# Patient Record
Sex: Female | Born: 1935
Health system: Southern US, Community
[De-identification: ages and names within clinical notes are randomized; demographics above are authoritative.]

## PROBLEM LIST (undated history)

## (undated) DIAGNOSIS — G4733 Obstructive sleep apnea (adult) (pediatric): Secondary | ICD-10-CM

## (undated) DIAGNOSIS — K219 Gastro-esophageal reflux disease without esophagitis: Secondary | ICD-10-CM

## (undated) DIAGNOSIS — Z9989 Dependence on other enabling machines and devices: Secondary | ICD-10-CM

## (undated) DIAGNOSIS — E559 Vitamin D deficiency, unspecified: Secondary | ICD-10-CM

## (undated) DIAGNOSIS — I10 Essential (primary) hypertension: Secondary | ICD-10-CM

## (undated) DIAGNOSIS — I4892 Unspecified atrial flutter: Secondary | ICD-10-CM

## (undated) DIAGNOSIS — E785 Hyperlipidemia, unspecified: Secondary | ICD-10-CM

## (undated) DIAGNOSIS — I4891 Unspecified atrial fibrillation: Secondary | ICD-10-CM

## (undated) DIAGNOSIS — E119 Type 2 diabetes mellitus without complications: Secondary | ICD-10-CM

## (undated) HISTORY — DX: Hyperlipidemia, unspecified: E78.5

## (undated) HISTORY — DX: Essential (primary) hypertension: I10

## (undated) HISTORY — PX: CHOLECYSTECTOMY: SHX55

## (undated) HISTORY — DX: Dependence on other enabling machines and devices: Z99.89

## (undated) HISTORY — DX: Unspecified atrial flutter: I48.92

## (undated) HISTORY — DX: Obstructive sleep apnea (adult) (pediatric): G47.33

## (undated) HISTORY — PX: ABDOMINAL HYSTERECTOMY: SHX81

## (undated) HISTORY — DX: Unspecified atrial fibrillation: I48.91

## (undated) HISTORY — DX: Type 2 diabetes mellitus without complications: E11.9

## (undated) HISTORY — DX: Gastro-esophageal reflux disease without esophagitis: K21.9

## (undated) HISTORY — DX: Vitamin D deficiency, unspecified: E55.9

---

## 2002-08-30 ENCOUNTER — Ambulatory Visit (HOSPITAL_COMMUNITY): Admission: RE | Admit: 2002-08-30 | Discharge: 2002-08-31 | Payer: Self-pay | Admitting: Internal Medicine

## 2004-07-31 ENCOUNTER — Ambulatory Visit: Payer: Self-pay | Admitting: Cardiology

## 2004-08-07 ENCOUNTER — Ambulatory Visit: Payer: Self-pay | Admitting: Cardiology

## 2004-08-24 ENCOUNTER — Ambulatory Visit: Payer: Self-pay | Admitting: Cardiology

## 2004-09-07 ENCOUNTER — Ambulatory Visit: Payer: Self-pay | Admitting: Cardiology

## 2004-09-21 ENCOUNTER — Ambulatory Visit: Payer: Self-pay | Admitting: Cardiology

## 2004-09-27 ENCOUNTER — Ambulatory Visit: Payer: Self-pay | Admitting: Internal Medicine

## 2004-10-04 ENCOUNTER — Ambulatory Visit: Payer: Self-pay | Admitting: Cardiology

## 2004-10-12 ENCOUNTER — Ambulatory Visit: Payer: Self-pay | Admitting: Cardiology

## 2004-10-19 ENCOUNTER — Ambulatory Visit: Payer: Self-pay | Admitting: Cardiology

## 2004-11-15 ENCOUNTER — Ambulatory Visit: Payer: Self-pay | Admitting: Cardiology

## 2004-11-22 ENCOUNTER — Ambulatory Visit: Payer: Self-pay | Admitting: Cardiology

## 2004-11-30 ENCOUNTER — Ambulatory Visit: Payer: Self-pay | Admitting: Internal Medicine

## 2004-12-20 ENCOUNTER — Ambulatory Visit: Payer: Self-pay | Admitting: Cardiology

## 2005-01-17 ENCOUNTER — Ambulatory Visit: Payer: Self-pay | Admitting: Cardiology

## 2005-01-24 ENCOUNTER — Ambulatory Visit: Payer: Self-pay | Admitting: Cardiology

## 2005-02-25 ENCOUNTER — Ambulatory Visit: Payer: Self-pay | Admitting: Cardiology

## 2005-03-05 ENCOUNTER — Ambulatory Visit: Payer: Self-pay | Admitting: Cardiology

## 2005-03-15 ENCOUNTER — Ambulatory Visit: Payer: Self-pay | Admitting: Internal Medicine

## 2005-04-11 ENCOUNTER — Ambulatory Visit: Payer: Self-pay | Admitting: Cardiology

## 2005-04-18 ENCOUNTER — Ambulatory Visit: Payer: Self-pay | Admitting: Cardiology

## 2005-05-20 ENCOUNTER — Ambulatory Visit: Payer: Self-pay | Admitting: Cardiology

## 2005-06-10 ENCOUNTER — Ambulatory Visit: Payer: Self-pay | Admitting: Cardiology

## 2005-06-14 ENCOUNTER — Ambulatory Visit: Payer: Self-pay | Admitting: Cardiology

## 2005-06-21 ENCOUNTER — Ambulatory Visit: Payer: Self-pay | Admitting: Cardiology

## 2005-08-13 ENCOUNTER — Ambulatory Visit: Payer: Self-pay | Admitting: Cardiology

## 2005-09-10 ENCOUNTER — Ambulatory Visit: Payer: Self-pay | Admitting: Cardiology

## 2005-09-17 ENCOUNTER — Ambulatory Visit: Payer: Self-pay | Admitting: Cardiology

## 2005-09-26 ENCOUNTER — Ambulatory Visit: Payer: Self-pay | Admitting: Cardiology

## 2005-10-03 ENCOUNTER — Ambulatory Visit: Payer: Self-pay | Admitting: Cardiology

## 2005-10-11 ENCOUNTER — Ambulatory Visit: Payer: Self-pay | Admitting: Internal Medicine

## 2005-10-18 ENCOUNTER — Ambulatory Visit: Payer: Self-pay | Admitting: Cardiology

## 2005-10-24 ENCOUNTER — Ambulatory Visit: Payer: Self-pay | Admitting: Cardiology

## 2005-11-07 ENCOUNTER — Ambulatory Visit: Payer: Self-pay | Admitting: Cardiology

## 2005-11-11 ENCOUNTER — Ambulatory Visit: Payer: Self-pay | Admitting: Cardiology

## 2005-11-13 ENCOUNTER — Ambulatory Visit: Payer: Self-pay | Admitting: Cardiology

## 2005-11-18 ENCOUNTER — Ambulatory Visit: Payer: Self-pay | Admitting: Cardiology

## 2005-12-16 ENCOUNTER — Ambulatory Visit: Payer: Self-pay | Admitting: Cardiology

## 2005-12-25 ENCOUNTER — Ambulatory Visit: Payer: Self-pay | Admitting: Cardiology

## 2006-01-08 ENCOUNTER — Ambulatory Visit: Payer: Self-pay | Admitting: Cardiology

## 2006-01-10 ENCOUNTER — Ambulatory Visit: Payer: Self-pay | Admitting: Cardiology

## 2006-01-24 ENCOUNTER — Ambulatory Visit: Payer: Self-pay | Admitting: Cardiology

## 2006-01-27 ENCOUNTER — Ambulatory Visit: Payer: Self-pay | Admitting: Cardiology

## 2006-01-30 ENCOUNTER — Ambulatory Visit: Payer: Self-pay | Admitting: Cardiology

## 2006-02-03 ENCOUNTER — Ambulatory Visit: Payer: Self-pay | Admitting: Cardiology

## 2006-02-11 ENCOUNTER — Ambulatory Visit: Payer: Self-pay | Admitting: Cardiology

## 2006-02-19 ENCOUNTER — Ambulatory Visit: Payer: Self-pay | Admitting: Cardiology

## 2006-02-27 ENCOUNTER — Ambulatory Visit: Payer: Self-pay | Admitting: Cardiology

## 2006-03-03 ENCOUNTER — Ambulatory Visit: Payer: Self-pay | Admitting: Cardiology

## 2006-03-06 ENCOUNTER — Ambulatory Visit: Payer: Self-pay | Admitting: Cardiology

## 2006-03-25 ENCOUNTER — Ambulatory Visit: Payer: Self-pay | Admitting: Cardiology

## 2006-03-27 ENCOUNTER — Ambulatory Visit: Payer: Self-pay | Admitting: Cardiology

## 2006-03-31 ENCOUNTER — Ambulatory Visit: Payer: Self-pay | Admitting: Cardiology

## 2006-04-04 ENCOUNTER — Ambulatory Visit: Payer: Self-pay | Admitting: Cardiology

## 2006-04-07 ENCOUNTER — Ambulatory Visit: Payer: Self-pay | Admitting: Cardiology

## 2006-04-11 ENCOUNTER — Ambulatory Visit: Payer: Self-pay | Admitting: Cardiology

## 2006-04-18 ENCOUNTER — Ambulatory Visit: Payer: Self-pay | Admitting: Cardiology

## 2006-05-02 ENCOUNTER — Ambulatory Visit: Payer: Self-pay | Admitting: Cardiology

## 2006-05-29 ENCOUNTER — Ambulatory Visit: Payer: Self-pay | Admitting: Cardiology

## 2006-07-24 ENCOUNTER — Ambulatory Visit: Payer: Self-pay | Admitting: Cardiology

## 2006-07-31 ENCOUNTER — Ambulatory Visit: Payer: Self-pay | Admitting: Cardiology

## 2006-08-15 ENCOUNTER — Ambulatory Visit: Payer: Self-pay | Admitting: Cardiology

## 2006-08-26 ENCOUNTER — Ambulatory Visit: Payer: Self-pay | Admitting: Cardiology

## 2006-09-04 ENCOUNTER — Ambulatory Visit: Payer: Self-pay | Admitting: Cardiology

## 2006-09-17 ENCOUNTER — Ambulatory Visit: Payer: Self-pay | Admitting: Cardiology

## 2006-09-26 ENCOUNTER — Ambulatory Visit: Payer: Self-pay | Admitting: Cardiology

## 2006-12-29 ENCOUNTER — Encounter: Payer: Self-pay | Admitting: Cardiology

## 2007-05-18 ENCOUNTER — Ambulatory Visit: Payer: Self-pay | Admitting: Cardiology

## 2007-06-16 ENCOUNTER — Ambulatory Visit: Payer: Self-pay | Admitting: Cardiology

## 2007-07-14 ENCOUNTER — Ambulatory Visit: Payer: Self-pay | Admitting: Cardiology

## 2007-07-24 ENCOUNTER — Ambulatory Visit: Payer: Self-pay | Admitting: Cardiology

## 2007-08-07 ENCOUNTER — Ambulatory Visit: Payer: Self-pay | Admitting: Cardiology

## 2007-08-27 ENCOUNTER — Ambulatory Visit: Payer: Self-pay | Admitting: Cardiology

## 2007-09-04 ENCOUNTER — Ambulatory Visit: Payer: Self-pay | Admitting: Cardiology

## 2007-10-02 ENCOUNTER — Ambulatory Visit: Payer: Self-pay | Admitting: Cardiology

## 2007-10-30 ENCOUNTER — Ambulatory Visit: Payer: Self-pay | Admitting: Cardiology

## 2007-12-03 ENCOUNTER — Ambulatory Visit: Payer: Self-pay | Admitting: Cardiology

## 2007-12-24 ENCOUNTER — Ambulatory Visit: Payer: Self-pay | Admitting: Cardiology

## 2008-01-26 ENCOUNTER — Ambulatory Visit: Payer: Self-pay | Admitting: Cardiology

## 2008-02-11 ENCOUNTER — Ambulatory Visit: Payer: Self-pay | Admitting: Cardiology

## 2008-03-11 ENCOUNTER — Ambulatory Visit: Payer: Self-pay | Admitting: Cardiology

## 2008-04-11 ENCOUNTER — Ambulatory Visit: Payer: Self-pay | Admitting: Cardiology

## 2008-05-16 ENCOUNTER — Ambulatory Visit: Payer: Self-pay | Admitting: Cardiology

## 2008-05-31 ENCOUNTER — Ambulatory Visit: Payer: Self-pay | Admitting: Cardiology

## 2008-07-01 ENCOUNTER — Ambulatory Visit: Payer: Self-pay | Admitting: Cardiology

## 2008-07-15 ENCOUNTER — Ambulatory Visit: Payer: Self-pay | Admitting: Cardiology

## 2008-07-26 ENCOUNTER — Ambulatory Visit: Payer: Self-pay | Admitting: Cardiology

## 2008-08-17 ENCOUNTER — Ambulatory Visit: Payer: Self-pay | Admitting: Cardiology

## 2008-09-02 ENCOUNTER — Ambulatory Visit: Payer: Self-pay | Admitting: Cardiology

## 2008-09-27 ENCOUNTER — Ambulatory Visit: Payer: Self-pay | Admitting: Cardiology

## 2008-10-17 ENCOUNTER — Encounter: Payer: Self-pay | Admitting: Cardiology

## 2008-11-04 ENCOUNTER — Ambulatory Visit: Payer: Self-pay | Admitting: Cardiology

## 2008-11-29 ENCOUNTER — Ambulatory Visit: Payer: Self-pay | Admitting: Cardiology

## 2008-12-08 ENCOUNTER — Encounter: Payer: Self-pay | Admitting: Cardiology

## 2008-12-08 ENCOUNTER — Ambulatory Visit: Payer: Self-pay | Admitting: Cardiology

## 2008-12-14 ENCOUNTER — Ambulatory Visit: Payer: Self-pay | Admitting: Cardiology

## 2009-03-23 ENCOUNTER — Encounter: Payer: Self-pay | Admitting: Cardiology

## 2009-04-28 ENCOUNTER — Ambulatory Visit: Payer: Self-pay | Admitting: Cardiology

## 2009-04-28 ENCOUNTER — Encounter: Payer: Self-pay | Admitting: Cardiology

## 2009-05-05 ENCOUNTER — Ambulatory Visit: Payer: Self-pay | Admitting: Cardiology

## 2009-05-08 ENCOUNTER — Encounter: Payer: Self-pay | Admitting: *Deleted

## 2009-05-18 ENCOUNTER — Encounter (INDEPENDENT_AMBULATORY_CARE_PROVIDER_SITE_OTHER): Payer: Self-pay | Admitting: *Deleted

## 2009-05-18 DIAGNOSIS — I4891 Unspecified atrial fibrillation: Secondary | ICD-10-CM | POA: Insufficient documentation

## 2009-05-18 DIAGNOSIS — I4892 Unspecified atrial flutter: Secondary | ICD-10-CM

## 2009-05-18 DIAGNOSIS — I10 Essential (primary) hypertension: Secondary | ICD-10-CM | POA: Insufficient documentation

## 2009-08-14 ENCOUNTER — Encounter: Payer: Self-pay | Admitting: Cardiology

## 2009-08-23 ENCOUNTER — Encounter: Payer: Self-pay | Admitting: Cardiology

## 2009-11-27 ENCOUNTER — Telehealth (INDEPENDENT_AMBULATORY_CARE_PROVIDER_SITE_OTHER): Payer: Self-pay | Admitting: *Deleted

## 2010-01-17 ENCOUNTER — Ambulatory Visit: Payer: Self-pay | Admitting: Cardiology

## 2010-01-17 DIAGNOSIS — F411 Generalized anxiety disorder: Secondary | ICD-10-CM | POA: Insufficient documentation

## 2010-10-23 NOTE — Progress Notes (Signed)
Summary: NEED OV  Phone Note Outgoing Call Call back at [facility] Phone [phone]   Call placed by: Georgina Peer,  November 27, 2009 4:44 PM Call placed to: Patient Summary of Call: called and left message on voicemail that she was overdue for f/u and to please call office to schedule ov.  Initial call taken by: Georgina Peer,  November 27, 2009 4:45 PM

## 2010-10-23 NOTE — Assessment & Plan Note (Signed)
Summary: 6 MONTH FU -RECV LETTER VS   Visit Type:  Follow-up Primary Provider:  Woody Seller  CC:  follow-up visit.  History of Present Illness: the patient is a 75 year old female with history of atrial flutter/atrial fibrillation. She is maintaining normal sinus rhythm on Flecainide. She is on Coumadin. The patient also significant anxiety.she has a history of hypertension which is well-controlled.  The patient still reports some anxiety with episodes of what she calls meltdowns. She denies any chest pain. She denies any orthopnea PND palpitations or syncope.from a cardiovascular standpoint is otherwise stable  Preventive Screening-Counseling & Management  Alcohol-Tobacco     Smoking Status: never     Year Quit: 1985  Current Medications (verified): 1)  Cardura 4 Mg Tabs (Doxazosin Mesylate) .... Take 1 Tablet By Mouth Once A Day 2)  Flecainide Acetate 100 Mg Tabs (Flecainide Acetate) .... Take 1 Tablet By Mouth Twice A Day 3)  Amlodipine Besylate 5 Mg Tabs (Amlodipine Besylate) .... Take One Tablet By Mouth Daily 4)  Citalopram Hydrobromide 20 Mg Tabs (Citalopram Hydrobromide) .... Take 1 Tablet By Mouth Once A Day 5)  Aspirin 81 Mg Tbec (Aspirin) .... Take One Tablet By Mouth Daily 6)  Prilosec 20 Mg Cpdr (Omeprazole) .... Take 1 Tablet By Mouth Once A Day 7)  Hydrochlorothiazide 25 Mg Tabs (Hydrochlorothiazide) .... Take One Tablet By Mouth Daily. 8)  Gemfibrozil 600 Mg Tabs (Gemfibrozil) .... Take 1 Tablet By Mouth Two Times A Day 9)  Lopressor 50 Mg Tabs (Metoprolol Tartrate) .... Take 1 Tablet By Mouth Two Times A Day 10)  Tramadol Hcl 50 Mg Tabs (Tramadol Hcl) .... Every 6 Hours As Needed 11)  Crestor 10 Mg Tabs (Rosuvastatin Calcium) .... Take 1/2 Tablet By Mouth Once A Day 12)  Glimepiride 2 Mg Tabs (Glimepiride) .... Take 1 Tablet By Mouth Once A Day 13)  Metformin Hcl 500 Mg Tabs (Metformin Hcl) .... Take 1/2 Tablet By Mouth Once A Day 14)  Clonazepam 0.25 Mg Tbdp (Clonazepam)  .... May Use Two Times A Day As Needed  Allergies (verified): 1)  Amoxicillin (Amoxicillin) 2)  Codeine Phosphate (Codeine Phosphate)  Comments:  Nurse/Medical Assistant: The patient's medications and allergies were reviewed with the patient and were updated in the Medication and Allergy Lists. List reviewed.  Past History:  Past Medical History: Last updated: 05/18/2009 anxiety HYPERTENSION, UNSPECIFIED (ICD-401.9) ATRIAL FLUTTER (ICD-427.32) ATRIAL FIBRILLATION (ICD-427.31)    Family History: Negative FH of Diabetes, Hypertension, or Coronary Artery Disease  Social History: Retired  Tobacco Use - No.  Smoking Status:  never  Review of Systems       The patient complains of anxiety.  The patient denies fatigue, malaise, fever, weight gain/loss, vision loss, decreased hearing, hoarseness, chest pain, palpitations, shortness of breath, prolonged cough, wheezing, sleep apnea, coughing up blood, abdominal pain, blood in stool, nausea, vomiting, diarrhea, heartburn, incontinence, blood in urine, muscle weakness, joint pain, leg swelling, rash, skin lesions, headache, fainting, dizziness, depression, enlarged lymph nodes, easy bruising or bleeding, and environmental allergies.    Vital Signs:  Patient profile:   75 year old female Height:      66 inches Weight:      245 pounds BMI:     39.69 Pulse rate:   59 / minute BP sitting:   122 / 76  (left arm) Cuff size:   large  Vitals Entered By: Georgina Peer (January 17, 2010 11:10 AM)  Nutrition Counseling: Patient's BMI is greater than 25 and therefore counseled  on weight management options. CC: follow-up visit   Physical Exam  Additional Exam:  General: Well-developed, well-nourished in no distress head: Normocephalic and atraumatic eyes PERRLA/EOMI intact, conjunctiva and lids normal nose: No deformity or lesions mouth normal dentition, normal posterior pharynx neck: Supple, no JVD.  No masses, thyromegaly or abnormal  cervical nodes lungs: Normal breath sounds bilaterally without wheezing.  Normal percussion heart: regular rate and rhythm with normal S1 and S2, no S3 or S4.  PMI is normal.  No pathological murmurs abdomen: Normal bowel sounds, abdomen is soft and nontender without masses, organomegaly or hernias noted.  No hepatosplenomegaly musculoskeletal: Back normal, normal gait muscle strength and tone normal pulsus: Pulse is normal in all 4 extremities Extremities: No peripheral pitting edema neurologic: Alert and oriented x 3 skin: Intact without lesions or rashes cervical nodes: No significant adenopathy psychologic: Normal affect    EKG  Procedure date:  01/17/2010  Findings:      SB, NSTT  Impression & Recommendations:  Problem # 1:  ATRIAL FLUTTER (ICD-427.32) patient is maintaining normal sinus rhythm. We will continue flecainide. Her updated medication list for this problem includes:    Flecainide Acetate 100 Mg Tabs (Flecainide acetate) .Marland Kitchen... Take 1 tablet by mouth twice a day    Aspirin 81 Mg Tbec (Aspirin) .Marland Kitchen... Take one tablet by mouth daily    Lopressor 50 Mg Tabs (Metoprolol tartrate) .Marland Kitchen... Take 1 tablet by mouth two times a day  Problem # 2:  HYPERTENSION, UNSPECIFIED (ICD-401.9) well-controlled. No further adjustments in medications The following medications were removed from the medication list:    Diovan 320 Mg Tabs (Valsartan) .Marland Kitchen... Take 1 tablet by mouth once a day Her updated medication list for this problem includes:    Cardura 4 Mg Tabs (Doxazosin mesylate) .Marland Kitchen... Take 1 tablet by mouth once a day    Amlodipine Besylate 5 Mg Tabs (Amlodipine besylate) .Marland Kitchen... Take one tablet by mouth daily    Aspirin 81 Mg Tbec (Aspirin) .Marland Kitchen... Take one tablet by mouth daily    Hydrochlorothiazide 25 Mg Tabs (Hydrochlorothiazide) .Marland Kitchen... Take one tablet by mouth daily.    Lopressor 50 Mg Tabs (Metoprolol tartrate) .Marland Kitchen... Take 1 tablet by mouth two times a day  Problem # 3:  ANXIETY  (ICD-300.00) Assessment: New Increased citalopram to 20mg  by mouth qdaily. I also prescribed a short- term script of clonazepam.   Other Orders: EKG w/ Interpretation (93000)  Patient Instructions: 1)  Compression stockings 2)  Increase Citalopram 20mg  daily 3)  Clonazepam 0.25mg  two times a day as needed  4)  Follow up in  6 months  Prescriptions: CLONAZEPAM 0.25 MG TBDP (CLONAZEPAM) may use two times a day as needed  #30 x 0   Entered by:   Lovina Reach, LPN   Authorized by:   Terald Sleeper, MD, [facility]   Signed by:   Lovina Reach, LPN on D34-534   Method used:   Print then Give to Patient   [id number]   Handout requested. CITALOPRAM HYDROBROMIDE 20 MG TABS (CITALOPRAM HYDROBROMIDE) Take 1 tablet by mouth once a day  #30 x 2   Entered by:   Lovina Reach, LPN   Authorized by:   Terald Sleeper, MD, [facility]   Signed by:   Lovina Reach, LPN on D34-534   Method used:   Electronically to        [facility] (retail)       [address]  [place], [place]  [id number]       Ph: [phone]       Fax: [phone]   RxID:   [id number]

## 2010-11-23 ENCOUNTER — Ambulatory Visit: Payer: Self-pay | Admitting: Cardiology

## 2010-12-10 ENCOUNTER — Telehealth: Payer: Self-pay | Admitting: *Deleted

## 2010-12-14 ENCOUNTER — Telehealth: Payer: Self-pay | Admitting: *Deleted

## 2010-12-14 NOTE — Telephone Encounter (Signed)
Pt left message on voicemail stating that she needs refill for amlodipine for bp. She thought Dr. Woody Seller prescribed this but Dr. Lutricia Feil prescribed it. She states when saw Dr. Woody Seller 1 wk ago he told her to take 1 pill in am and pm. She states she now needs a new prescription called in for this increased number.  Contacted pt and notified her that Dr. Woody Seller should handle this dosage change/refill since he made the change to the prescription. Pt states she told his office this but was told to contact our office. She will call them back and ask for refill w/increased number.

## 2010-12-20 NOTE — Progress Notes (Signed)
Summary: PHONE: REFILLS  Phone Note Call from Patient Call back at Home Phone [phone]   Caller: Patient Reason for Call: Refill Medication Summary of Call: States that [facility] still does not have her updated prescriptions. Initial call taken by: Delfino Lovett,  December 10, 2010 9:54 AM  Follow-up for Phone Call        Patient informed of the above via machine.  Follow-up by: Georgina Peer,  December 10, 2010 10:06 AM    Prescriptions: CITALOPRAM HYDROBROMIDE 20 MG TABS (CITALOPRAM HYDROBROMIDE) Take 1 tablet by mouth once a day  #30 x 0   Entered by:   Georgina Peer   Authorized by:   Terald Sleeper, MD, [facility]   Signed by:   Georgina Peer on 12/10/2010   Method used:   Electronically to        [id number] * (retail)       [place]  [address]       Ph: [phone]       Fax: [phone]   RxID:   [id number] AMLODIPINE BESYLATE 5 MG TABS (AMLODIPINE BESYLATE) Take one tablet by mouth daily  #30 x 0   Entered by:   Georgina Peer   Authorized by:   Terald Sleeper, MD, [facility]   Signed by:   Georgina Peer on 12/10/2010   Method used:   Electronically to        [facility] [id number] * (retail)       [place]  [address]       Ph: [phone]       Fax: [phone]   [id number] FLECAINIDE ACETATE 100 MG TABS (FLECAINIDE ACETATE) Take 1 tablet by mouth twice a day  #60 x 0   Entered by:   Georgina Peer   Authorized by:   Terald Sleeper, MD, [facility]   Signed by:   Georgina Peer on 12/10/2010   Method used:   Electronically to        [id number] * (retail)       [place]  [address]       Ph: [phone]       Fax: [phone]   [id number] CARDURA 4 MG TABS (DOXAZOSIN MESYLATE) Take 1/2 tablet by mouth once a day  #15 x 0   Entered by:   Georgina Peer   Authorized by:   Terald Sleeper, MD, [facility]   Signed by:   Georgina Peer on 12/10/2010   Method  used:   Electronically to        [facility] [id number] * (retail)       [address] [place]  [address]       Ph: [phone]       Fax: [phone]   [id number]

## 2011-01-04 ENCOUNTER — Encounter: Payer: Self-pay | Admitting: Cardiology

## 2011-01-04 ENCOUNTER — Ambulatory Visit (INDEPENDENT_AMBULATORY_CARE_PROVIDER_SITE_OTHER): Payer: Medicare Other | Admitting: Cardiology

## 2011-01-04 VITALS — BP 113/59 | HR 56 | Ht 66.0 in | Wt 253.0 lb

## 2011-01-04 DIAGNOSIS — I4891 Unspecified atrial fibrillation: Secondary | ICD-10-CM

## 2011-01-04 DIAGNOSIS — Z7901 Long term (current) use of anticoagulants: Secondary | ICD-10-CM | POA: Insufficient documentation

## 2011-01-04 DIAGNOSIS — F411 Generalized anxiety disorder: Secondary | ICD-10-CM

## 2011-01-04 DIAGNOSIS — I10 Essential (primary) hypertension: Secondary | ICD-10-CM

## 2011-01-04 NOTE — Progress Notes (Signed)
HPI The patient is a 75 year old female with a history of atrial flutter/atrial fibrillation. Previously she was maintaining normal sinus rhythm and is on flecainide. She is also on Coumadin. She has a history of significant anxiety. She also has hypertension which is previously well controlled. Blood work done in September of 2011 showed a normal hemoglobin and normal renal function. She did have a low vitamin D level. 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 Fortunately she has not had to use any. I reviewed her electrocardiogram on the current dose of citalopram and the QTC is 467 ms. This is in conjunction with flecainide.  Allergies  Allergen Reactions  . Amoxicillin     REACTION: rash  . Codeine Phosphate     REACTION: vomiting    Current Outpatient Prescriptions on File Prior to Visit  Medication Sig Dispense Refill  . amLODipine (NORVASC) 5 MG tablet Take 5 mg by mouth 2 (two) times daily.       Marland Kitchen aspirin 81 MG tablet Take 81 mg by mouth daily.        . citalopram (CELEXA) 20 MG tablet Take 20 mg by mouth daily.        . clonazePAM (KLONOPIN) 0.25 MG disintegrating tablet Take 0.25 mg by mouth 2 (two) times daily as needed.        . doxazosin (CARDURA) 4 MG tablet Take 4 mg by mouth at bedtime.       . flecainide (TAMBOCOR) 100 MG tablet Take 100 mg by mouth 2 (two) times daily.        Marland Kitchen gemfibrozil (LOPID) 600 MG tablet Take 600 mg by mouth 2 (two) times daily before a meal.        . glimepiride (AMARYL) 2 MG tablet Take 2 mg by mouth daily before breakfast.        . hydrochlorothiazide 25 MG tablet Take 25 mg by mouth daily.        . metformin (FORTAMET) 500 MG (OSM) 24 hr tablet Take 250 mg by mouth daily with breakfast.       . metoprolol (LOPRESSOR) 50 MG tablet Take 50 mg by mouth 2 (two) times daily.        Marland Kitchen omeprazole (PRILOSEC) 20 MG capsule Take 20 mg by mouth daily.        . rosuvastatin (CRESTOR) 10 MG tablet Take 5 mg by mouth  daily.       . traMADol (ULTRAM) 50 MG tablet Take 50 mg by mouth every 6 (six) hours as needed.          Past Medical History  Diagnosis Date  . Hypertension   . Arrhythmia     atrial flutter    No past surgical history on file.  No family history on file.  History   Social History  . Marital Status: Single    Spouse Name: N/A    Number of Children: N/A  . Years of Education: N/A   Occupational History  . Not on file.   Social History Main Topics  . Smoking status: Former Smoker    Quit date: 09/23/1978  . Smokeless tobacco: Not on file  . Alcohol Use: No  . Drug Use: No  . Sexually Active: Not on file   Other Topics Concern  . Not on file   Social History Narrative  . No narrative on file   Review of systems:Pertinent positives as outlined above. The remainder of  the 18  point review of systems is negative   PHYSICAL EXAM BP 113/59  Pulse 56  Ht 5\' 6"  (1.676 m)  Wt 253 lb (114.76 kg)  BMI 40.84 kg/m2  General: Well-developed, well-nourished in no distress Head: Normocephalic and atraumatic Eyes:PERRLA/EOMI intact, conjunctiva and lids normal Ears: No deformity or lesions Mouth:normal dentition, normal posterior pharynx Neck: Supple, no JVD.  No masses, thyromegaly or abnormal cervical nodes Lungs: Normal breath sounds bilaterally without wheezing.  Normal percussion Cardiac: regular rate and rhythm with normal S1 and S2, no S3 or S4.  PMI is normal.  No pathological murmurs Abdomen: Normal bowel sounds, abdomen is soft and nontender without masses, organomegaly or hernias noted.  No hepatosplenomegaly MSK: Back normal, normal gait muscle strength and tone normal Vascular: Pulse is normal in all 4 extremities Extremities: No peripheral pitting edema Neurologic: Alert and oriented x 3 Skin: Intact without lesions or rashes Lymphatics: No significant adenopathy Psychologic: Normal affect   [id number] sinus rhythm. The QTC 467 ms  ASSESSMENT AND  PLAN

## 2011-01-04 NOTE — Patient Instructions (Addendum)
   Continue current medications. Your physician recommends that you go to the [facility] for lab work - flecainide level. If the results of your test are normal or stable, you will receive a letter.  If they are abnormal, the nurse will contact you by phone. Your physician wants you to follow up in:  1 year.  You will receive a reminder letter in the mail one-two months in advance.  If you don't receive a letter, please call our office to schedule the follow up appointment

## 2011-01-04 NOTE — Assessment & Plan Note (Addendum)
Normal QTC on flecainide and citalopram: However we will obtain flecainide levels to make sure that the patient does not have significant elevation in her levels. Patient remains in normal sinus rhythm

## 2011-01-04 NOTE — Assessment & Plan Note (Signed)
Blood pressure is well controlled no further medication adjustments.

## 2011-01-28 ENCOUNTER — Telehealth: Payer: Self-pay | Admitting: *Deleted

## 2011-01-28 DIAGNOSIS — F419 Anxiety disorder, unspecified: Secondary | ICD-10-CM

## 2011-01-28 MED ORDER — CITALOPRAM HYDROBROMIDE 20 MG PO TABS: 20.0000 mg | ORAL_TABLET | Freq: Every day | ORAL | Status: DC

## 2011-01-28 NOTE — Telephone Encounter (Signed)
Patiient called inquiring about Flecainide level results.  Advised pt that this result was normal.  Will go ahead & refill Citalopram since level is normal & no interaction.  States she has been off med since last OV.

## 2011-02-02 NOTE — Telephone Encounter (Signed)
Although flecainide level is normal.  There is still an interaction between citalopram and  Flecainide. There is increased risk of cardiac arrhythmias even with normal Flecainide levels.  If patient has not been taking citalopram up until this point that I would not restart citalopram and discontinued.  If patient needs antidepressants she should discuss this further with her primary care physician, but citalopram does congfer for increased risk for rhythm abnormalities.

## 2011-02-05 NOTE — Assessment & Plan Note (Signed)
[name], [name]                       MRN:          [id number]  DATE:08/27/2007                            DOB:          11-Sep-1936    HISTORY OF PRESENT ILLNESS:  Patient is a 75 year old female with a  history of flutter  ablation as well as atrial fibrillation on  flecainide therapy, maintaining a normal sinus rhythm.  The patient has  been doing quite well.  She reports no chest pain, shortness of breath,  orthopnea, or PND.  She also reports no palpitations.   She is followed in our Coumadin clinic here, and her last INR was 2.5.   MEDICATIONS:  1. Cardura 4 mg p.o. daily.  2. Coumadin 5 mg as directed.  3. Lopressor 50 mg p.o. b.i.d.  4. Flecainide 30 mg p.o. b.i.d.  5. Aspirin 81 mg p.o. daily.  6. Prilosec 20 mg p.o. daily.  7. Hydrochlorothiazide 25 mg p.o. daily.  8. Lovastatin 20 mg p.o. daily.   PHYSICAL EXAMINATION:  VITAL SIGNS:  Blood pressure is 149/80.  Heart  rate 60 beats per minute.  Weight is 232 pounds.  HEENT:  Pupils are equal, round and reactive to light.  NECK:  Normal carotid upstrokes.  No carotid bruits.  LUNGS:  Clear breath sounds bilaterally.  HEART:  Regular rate and rhythm.  Normal S1 and S2.  No murmurs, gallops  or rubs.  ABDOMEN:  Soft, nontender.  No rebound or guarding.  Good bowel sounds.  EXTREMITIES:  No clubbing, cyanosis or edema.  NEURO:  Patient is alert and oriented, grossly nonfocal.   PROBLEM LIST:  1. History of atrial flutter.      a.     Status post flutter ablation.      b.     Atrial fibrillation, maintaining a normal sinus rhythm on       flecainide therapy.      c.     Chronic Coumadin therapy.  2. Bradycardia with first-degree atrioventricular block.   PLAN:  1. I have told the patient that we certainly can stop her aspirin, and      she can be continued on Coumadin alone.  2. Patient will have an  echocardiographic study done to evaluate heart      function and valvular      apparatus.  3. Patient can follow up with [place] in one year.     Ernestine Mcmurray, MD,FACC  Electronically Signed    GED/MedQ  DD: 08/27/2007  DT: 08/27/2007  Job #: [id number]

## 2011-02-05 NOTE — Assessment & Plan Note (Signed)
[place] OFFICE NOTE   NAME:[name], [name]                       MRN:          [id number]  DATE:12/08/2008                            DOB:          1936-03-31    HISTORY OF PRESENT ILLNESS:  The patient is a very pleasant 75 year old  female with a history of prior atrial flutter as well as atrial  fibrillation, but maintaining normal sinus rhythm with flecainide.  The  patient's CHADS score is only 1.  However, according to the CHADS score  of Dr. Caryl Comes, it would be a 3.  I discussed whether with the patient  that with the latter score.  We really do not have any prospective data  to validate the score system.  Therefore, I have given her an option to  either continue with Coumadin or aspirin she has chosen for the latter.  Unfortunately, her blood pressure has been poorly controlled since she  has been taken off Cardura.  Other than that, she has no cardiovascular  symptoms and is doing quite well.   MEDICATIONS:  1. Coumadin as directed.  2. Lopressor 50 mg p.o. b.i.d.  3. Flecainide 100 mg p.o. b.i.d.  4. Prilosec 20 mg p.o. daily.  5. Hydrochlorothiazide 25 mg p.o. daily.  6. Lovastatin 20 mg p.o. daily.  7. Diovan 320 mg p.o. daily.  8. Gemfibrozil 600 mg p.o. b.i.d.  9. Prednisolone.  10.Eye drops.   PHYSICAL EXAMINATION:  VITAL SIGNS:  Blood pressure is 170/89, heart  rate is 59, weight is 222 pounds.  NECK:  Normal carotid upstroke.  No carotid bruits.  LUNGS:  Clear breath sounds bilaterally.  HEART:  Regular rate and rhythm.  Normal S1 and S2.  No murmur, rubs, or  gallops.  ABDOMEN:  Soft and nontender.  No rebound or guarding.  Good bowel  sounds.  EXTREMITIES:  No cyanosis, clubbing, or edema.   PROBLEMS:  1. History of atrial flutter, status post flutter ablation.  2. History of paroxysmal atrial fibrillation maintained normal sinus      rhythm on flecainide therapy.  3. Warfarin  therapy.  4. Bradycardia with first-degree atrioventricular block.   PLAN:  1. I told the patient that her CHADS score is only 1 and at least      until age 75, she can just be maintained on aspirin.  I have given      the patient the current guidelines and she is in agreement with      this and she will stop Coumadin and start aspirin.  2. I also start the patient back on Cardura 2 mg a day and will have a      nurse check next week      for her blood pressure.  3. The patient can continue on flecainide.     Ernestine Mcmurray, MD,FACC  Electronically Signed    GED/MedQ  DD: 12/08/2008  DT: 12/09/2008  Job #: [id number]   cc:   Jerene Bears, MD

## 2011-02-05 NOTE — Assessment & Plan Note (Signed)
[place] OFFICE NOTE   NAME:[name], [name]                       MRN:          [id number]  DATE:04/28/2009                            DOB:          December 17, 1935    REFERRING PHYSICIAN:  Jerene Bears, MD   HISTORY OF PRESENT ILLNESS:  The patient is a pleasant 75 year old  female with a history of atrial flutter as well as atrial fibrillation.  She is to maintain normal sinus rhythm on flecainide.  She has no  significant breakthrough episodes.  The patient has a low CHADS score  and is currently not on Coumadin.  The patient is under significant  amount of stress.  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  The patient states that she becomes  anxious very easily and may even have some panic attacks at times.  She  also is short tempered.  She denies, however, any symptoms of  depression.   MEDICATIONS:  1. Lopressor 50 mg p.o. b.i.d.  2. Flecainide 100 mg p.o. b.i.d.  3. Prilosec 20 mg p.o. daily.  4. Hydrochlorothiazide 25 mg p.o. b.i.d.  5. Lovastatin 20 mg p.o. daily.  6. Gemfibrozil 600 mg p.o. b.i.d.  7. Cardura 4 mg half a tablet daily.  8. Aspirin 81 mg p.o. daily.   Of note that the patient had tried an ARB and developed angioedema.  She  also could not tolerate clonidine which made her extremely fatigued.   PHYSICAL EXAMINATION:  VITAL SIGNS:  Blood pressure 160/90, heart rate  49, weight 236.  GENERAL:  Well-nourished, white female, in no apparent distress.  HEENT:  Pupils, eyes clear; conjunctivae clear.  NECK:  Supple.  Normal carotid upstroke.  No carotid bruits.  LUNGS:  Clear breath sounds bilaterally.  HEART:  Regular rate and rhythm with normal S1 and S2.  No murmurs,  rubs, or gallops.  ABDOMEN:  Soft, nontender.  No rebound or guarding.  Good bowel sounds.  EXTREMITIES:  No cyanosis, clubbing, or edema.   PROBLEM LIST:  1. History of  atrial flutter, status post flutter ablation.  2. History of paroxysmal atrial fibrillation on flecainide therapy to      maintain normal sinus rhythm.  3. Warfarin therapy discontinued.  4. Anxiety.  5. Hypertension poorly controlled.   PLAN:  1. The patient will be placed on Norvasc 5 mg p.o. daily for better      blood pressure control.  2. The patient clearly also has generalized anxiety disorder as well      as obstructive sleep apnea the former as well as the latter      contributing to her hypertension.  I have discussed with the      patient the need for consideration of mood stabilizers and she is      willing to start citalopram 20 mg p.o. daily.     Ernestine Mcmurray, MD,FACC  Electronically Signed    GED/MedQ  DD: 04/28/2009  DT: 04/29/2009  Job #: [id number]   cc:  Jerene Bears, MD

## 2011-02-06 NOTE — Telephone Encounter (Signed)
Patient notified.  States she was going into Dr. Woody Seller office now & she would discuss with him at [facility].  Patient verbalized understanding.

## 2011-02-06 NOTE — Telephone Encounter (Signed)
Left message to return call 

## 2011-02-08 NOTE — Op Note (Signed)
NAMEMYLEY, TREHARNE NO.:  [id number]   MEDICAL RECORD NO.:  [id number]                   PATIENT TYPE:  OIB   LOCATION:  2899                                 FACILITY:  [place]   PHYSICIAN:  Champ Mungo. Lovena Le, M.D. [facility]           DATE OF BIRTH:  Aug 18, 1936   DATE OF PROCEDURE:  08/30/2002  DATE OF DISCHARGE:                                 OPERATIVE REPORT   PROCEDURE:  Invasive electrophysiologic study with radiofrequency catheter  ablation of atrial flutter and direct current cardioversion.   INTRODUCTION:  The patient is a very pleasant 75 year old woman with a  history of paroxysmal atrial flutter who was admitted to the hospital back  in October with atrial flutter and a rapid ventricular rate.  She  subsequently went back with sinus rhythm.  She has a history of sleep apnea.  A Cardiolite stress test was obtained demonstrating normal LV function with  no ischemia.  The patient notes that when her heart goes out of rhythm she  gets jaw pain.  She was now referred for electrophysiologic study and  catheter ablation.   DESCRIPTION OF PROCEDURE:  After informed consent was obtained, the patient  was taken to the diagnostic EP lab in the fasted state.  After the usual  preparation and draping, intravenous fentanyl and midazolam were given for  sedation.  A 6 French hexapolar catheter was inserted percutaneously in the  right jugular vein and advanced to the coronary sinus.  A 7 French 20-pole  halo catheter was inserted percutaneously in the right femoral vein and  advanced to the right atrium.  A 5 French quadripolar catheter was inserted  percutaneously in the right femoral vein and advanced to the His bundle  region.  After measurement of the basic intervals, programmed atrial  stimulation was carried out from the coronary sinus at a basic drive cycle  length of 500 msec.  The S1-S2 interval was stepwise decreased from 440 msec  down to 270 msec,  resulting in the AV node ERP.  During programmed atrial  stimulation there was AH jump and echo beats but no inducible AVNRT.  Next  rapid atrial pacing was carried out from the coronary sinus at a pacing  cycle length of 490 msec and stepwise decreased down to 360 msec, where AV  Wenckebach was observed.  During rapid atrial pacing the PR interval  remained less than the RR interval.  The pacing interval was subsequently  continuously decremented down to 230 msec, resulting in the initiation of  atrial flutter.  During atrial flutter the atrial activation sequence was  counterclockwise around the tricuspid valve annulus.  In the coronary sinus  the proximal activation was before the distal.  During atrial flutter the  patient spontaneously developed atrial fibrillation.  This persisted.  She  was given ibutilide, followed by Lopressor, followed by DC cardioversion,  restoring sinus rhythm.  Mapping was then carried out and during mapping,  the patient had return of spontaneous atrial fibrillation.  Eleven RF energy  applications were subsequently delivered to the usual atrial flutter isthmus  during atrial fibrillation.  DC cardioversion was again carried out, and the  patient was demonstrated to have atrial flutter isthmus block.  At this  point the patient was observed 30 minutes.  At the end of 30 minutes,  additional pacing demonstrated return of atrial flutter isthmus conduction.  Six additional RF energy applications were then delivered and isthmus block  was obtained.  The catheter was then removed, hemostasis assured, and the  patient returned to her room in satisfactory condition.   COMPLICATIONS:  There were no immediate procedural complications.   RESULTS:  A.  BASELINE ELECTROCARDIOGRAM:  The baseline ECG demonstrates normal sinus  rhythm with normal axis and intervals.   B.  BASELINE INTERVALS:  The HV interval was 44 msec.  The sinus node cycle  length was 837 msec.  The  QRS duration 110 msec.   C.  RAPID ATRIAL PACING:  Rapid atrial pacing demonstrated an AV Wenckebach  cycle length of 360 msec.  During rapid atrial pacing the PR interval  remained less than the RR interval.   D.  PROGRAMMED ATRIAL STIMULATION:  Programmed atrial stimulation was  carried out from the coronary sinus with a basic drive cycle length of 500  msec.  The S1-S2 interval was stepwise decreased from 440 msec down to 270  msec, where the AV node ERP was observed.  During programmed atrial  stimulation there was an AH jump and echo beat but no inducible SVT.   E.  ARRHYTHMIAS OBSERVED:  1. Atrial flutter.  Initiation:  Rapid atrial pacing.  Duration:  Sustained.     Termination:  Spontaneous.  2. Atrial fibrillation.  Initiation:  Spontaneous from atrial flutter.     Duration:  Sustained.  Termination:  DC cardioversion.   F.  MAPPING:  Mapping of the patient's atrial flutter demonstrated typical  counterclockwise tricuspid annular reentry.  The cycle length was 230 msec.   G.  RADIOFREQUENCY ENERGY APPLICATION:  Eleven RF applications were  initially delivered, resulting in isthmus block.  Thirty minutes was allowed  to elapse, and the patient had recurrent isthmus condition, and an  additional six RF energy applications were delivered, resulting in the  creation of isthmus block.   CONCLUSION:  This study demonstrates successful electrophysiologic study and  RF catheter ablation of typical isthmus-dependent atrial flutter.  In  addition, the patient did have spontaneous atrial fibrillation, the  implications of which are unknown, as she has not had this clinically.                                                  Champ Mungo. Lovena Le, M.D. [facility]    GWT/MEDQ  D:  08/30/2002  T:  08/30/2002  Job:  [id number]   cc:   Jerene Bears  [place]  [place] [address]  Fax: [phone]   Satira Sark, M.D. [facility] [address] [place] [address]  Fax: [place], [place], [place], [facility]

## 2011-02-08 NOTE — Discharge Summary (Signed)
[name], [name]                            ACCOUNT NO.:  [id number]   MEDICAL RECORD NO.:  [id number]                   PATIENT TYPE:  OIB   LOCATION:  3711                                 FACILITY:  [place]   PHYSICIAN:  Champ Mungo. Lovena Le, M.D. [facility]           DATE OF BIRTH:  [birth year]   DATE OF ADMISSION:  08/30/2002  DATE OF DISCHARGE:                                 DISCHARGE SUMMARY   PRIMARY DIAGNOSIS:  Atrial flutter.   SECONDARY DIAGNOSES:  1. Hypertension.  2. Sleep apnea.  3. Gastroesophageal reflux disease.   HISTORY OF PRESENT ILLNESS:  This 75 year old female with obesity, sleep  apnea who used CPAP in the past. She has had a history of atrial flutter  initially beginning two years ago while traveling in [place]. At that  time she was hospitalized for two days. Apparently her flutter terminated  spontaneously. The second episode, approximately one and one-half years ago  while visiting [place], also another episode one year ago. She was recently  hospitalized in early October for recurrent atrial flutter. Again, the EKG  demonstrated 2 to 1 AV conduction. Evaluation with Cardiolite stress  demonstrated no ischemia. LV was preserved. Denies any history of syncope.  When she develops her atrial flutter she gets jaw pain and tensing as well  as shortness of breath with no frank syncope. The patient was admitted for  EP study and radiofrequency catheter ablations.   HOSPITAL COURSE:  The patient underwent radiofrequency catheter ablation of  her atrial flutter. The patient had an uncomplicated postoperative course  and was discharged to home the following day in stable condition.   DISCHARGE MEDICATIONS:  She was discharge on the following:  All of her  previous medications, Lopressor 50 mg b.i.d., Prilosec 20 nightly,  hydrochlorothiazide 25 daily, Cardura 4 at nightly, Coumadin 5 mg nightly,  Tylenol 1-2 tablets every 4-6 hours as needed for pain.    DISCHARGE  INSTRUCTIONS:  No heavy lifting or strenuous activities for four  days. No driving for two days. Low fat, low cholesterol, low salt diet. She  is to call if she develops any drainage or a lump in her groin. A PT INR was  scheduled for Friday in the [place] office at 8:45 a.m. A follow-up with Dr.  Cristopher Peru 1/19 at 11:30 a.m. The patient was also to take antibiotic  prophylaxis for three months and an enteric coated baby aspirin for six  weeks.     Forest Becker, C.R.N.P. [facility]                 Champ Mungo. Lovena Le, M.D. [facility]    DS/MEDQ  D:  08/31/2002  T:  08/31/2002  Job:  [id number]   cc:   Champ Mungo. Lovena Le, M.D. [facility]  [address] [address]  Fax: [employer] [employer]  [place] [address]  Fax: [phone]

## 2011-03-12 ENCOUNTER — Other Ambulatory Visit: Payer: Self-pay | Admitting: Cardiology

## 2011-03-12 MED ORDER — FLECAINIDE ACETATE 100 MG PO TABS: 100.0000 mg | ORAL_TABLET | Freq: Two times a day (BID) | ORAL | Status: DC

## 2011-10-08 ENCOUNTER — Other Ambulatory Visit: Payer: Self-pay | Admitting: *Deleted

## 2011-10-08 MED ORDER — FLECAINIDE ACETATE 100 MG PO TABS: 100.0000 mg | ORAL_TABLET | Freq: Two times a day (BID) | ORAL | Status: DC

## 2011-12-27 ENCOUNTER — Encounter: Payer: Self-pay | Admitting: *Deleted

## 2012-02-28 ENCOUNTER — Encounter: Payer: Self-pay | Admitting: Cardiology

## 2012-02-28 ENCOUNTER — Ambulatory Visit (INDEPENDENT_AMBULATORY_CARE_PROVIDER_SITE_OTHER): Payer: Medicare Other | Admitting: Physician Assistant

## 2012-02-28 VITALS — BP 124/76 | HR 60 | Resp 18 | Ht 66.0 in | Wt 228.0 lb

## 2012-02-28 DIAGNOSIS — I10 Essential (primary) hypertension: Secondary | ICD-10-CM

## 2012-02-28 DIAGNOSIS — F411 Generalized anxiety disorder: Secondary | ICD-10-CM

## 2012-02-28 DIAGNOSIS — Z79899 Other long term (current) drug therapy: Secondary | ICD-10-CM

## 2012-02-28 DIAGNOSIS — I4891 Unspecified atrial fibrillation: Secondary | ICD-10-CM

## 2012-02-28 MED ORDER — ASPIRIN EC 325 MG PO TBEC: 325.0000 mg | DELAYED_RELEASE_TABLET | Freq: Every day | ORAL | Status: AC

## 2012-02-28 NOTE — Progress Notes (Signed)
HPI: Patient presents for routine followup.  Patient reports significant decrease in episodes of tachycardia palpitations, since last OV in April 2012. She has only had 2-3 episodes, of brief duration, with no significant associated symptoms. She reports that she was taken off Coumadin, by Dr. Dannielle Burn, approximately 2 years ago. She is on low-dose ASA. She remains on flecainide and metoprolol for rate/rhythm control.  Patient remains under considerable stress, given that she has full custody of her grandchild. She had been on Celexa in the past, but this was subsequently discontinued. Dr. Dannielle Burn did indicate that he would not recommend resuming this medication, given that it carried an increased risk of cardiac dysrhythmia, secondary to possible interaction with flecainide.  Will EKG today indicates continued maintenance of NSR with first degree AV block. QTC 0.492.  Allergies  Allergen Reactions  . Amoxicillin     REACTION: rash  . Codeine Phosphate     REACTION: vomiting    Current Outpatient Prescriptions  Medication Sig Dispense Refill  . amLODipine (NORVASC) 5 MG tablet Take 5 mg by mouth 2 (two) times daily.       . cetirizine (ZYRTEC) 10 MG tablet Take 10 mg by mouth daily.      . clonazePAM (KLONOPIN) 0.25 MG disintegrating tablet Take 0.25 mg by mouth 2 (two) times daily as needed.        . doxazosin (CARDURA) 4 MG tablet Take 4 mg by mouth at bedtime.       . fexofenadine (ALLEGRA) 180 MG tablet Take 180 mg by mouth daily.      . flecainide (TAMBOCOR) 100 MG tablet Take 1 tablet (100 mg total) by mouth 2 (two) times daily.  60 tablet  6  . gemfibrozil (LOPID) 600 MG tablet Take 600 mg by mouth 2 (two) times daily before a meal.        . glimepiride (AMARYL) 2 MG tablet Take 4 mg by mouth 2 (two) times daily.       . hydrochlorothiazide 25 MG tablet Take 25 mg by mouth daily.        . metformin (FORTAMET) 500 MG (OSM) 24 hr tablet Take 500 mg by mouth 2 (two) times daily with a  meal.       . metoprolol (LOPRESSOR) 50 MG tablet Take 50 mg by mouth 2 (two) times daily.        Marland Kitchen omeprazole (PRILOSEC) 20 MG capsule Take 20 mg by mouth daily.        . Vitamin D, Ergocalciferol, (DRISDOL) 50000 UNITS CAPS Take 50,000 Units by mouth every 7 (seven) days.      Marland Kitchen aspirin EC 325 MG tablet Take 1 tablet (325 mg total) by mouth daily.        Past Medical History  Diagnosis Date  . Hypertension   . Arrhythmia     atrial flutter  . OSA on CPAP   . GERD (gastroesophageal reflux disease)     Past Surgical History  Procedure Date  . Abdominal hysterectomy     for precancerous lesion  . Cholecystectomy     History   Social History  . Marital Status: Single    Spouse Name: N/A    Number of Children: N/A  . Years of Education: N/A   Occupational History  . Not on file.   Social History Main Topics  . Smoking status: Former Smoker    Quit date: 09/23/1978  . Smokeless tobacco: Not on file  . Alcohol Use: No  .  Drug Use: No  . Sexually Active: Not on file   Other Topics Concern  . Not on file   Social History Narrative  . No narrative on file    Family History  Problem Relation Age of Onset  . Lung cancer Father   . Heart failure Mother     ROS: no nausea, vomiting; no fever, chills; no melena, hematochezia; no claudication  PHYSICAL EXAM: BP 124/76  Pulse 60  Resp 18  Ht 5\' 6"  (1.676 m)  Wt 228 lb (103.42 kg)  BMI 36.80 kg/m2 GENERAL: 76 year old female, obese, sitting upright; NAD HEENT: NCAT, PERRLA, EOMI; sclera clear; no xanthelasma NECK: palpable bilateral carotid pulses, no bruits; unable to assess JVD, secondary to neck girth LUNGS: CTA bilaterally CARDIAC: RRR (S1, S2); no significant murmurs; no rubs or gallops ABDOMEN: Protuberant EXTREMETIES: no significant peripheral edema SKIN: warm/dry; no obvious rash/lesions MUSCULOSKELETAL: no joint deformity NEURO: no focal deficit; NL affect   EKG: reviewed and available in  Electronic Records   ASSESSMENT & PLAN:  Atrial fibrillation Maintaining NSR on flecainide and metoprolol. Patient previously on Coumadin, currently on low-dose ASA. Following discussion with Dr. Dannielle Burn, plan is to consider either resuming Coumadin or treating with Xarelto , at time of next OV in 6 months. Patient currently has a CHADS score of 2 (HTN, DM). In the interim, will increase ASA to 325 daily.  HYPERTENSION, UNSPECIFIED Well-controlled on current medication regimen  ANXIETY Following discussion with Dr. Dannielle Burn, patient was advised to consider Effexor as a suitable treatment option for her anxiety disorder. However, we'll defer to Dr. Woody Seller in prescribing this medication for her. From a cardiac standpoint this would be a safer option, rather than Celexa, with respect to possible adverse interaction with flecainide. The patient was appreciative of this recommendation.     Gene Adel Burch, PAC

## 2012-02-28 NOTE — Assessment & Plan Note (Addendum)
Following discussion with Dr. Dannielle Burn, patient was advised to consider Effexor as a suitable treatment option for her anxiety disorder. However, we'll defer to Dr. Woody Seller in prescribing this medication for her. From a cardiac standpoint this would be a safer option, rather than Celexa, with respect to possible adverse interaction with flecainide. The patient was appreciative of this recommendation.

## 2012-02-28 NOTE — Assessment & Plan Note (Signed)
Maintaining NSR on flecainide and metoprolol. Patient previously on Coumadin, currently on low-dose ASA. Following discussion with Dr. Dannielle Burn, plan is to consider either resuming Coumadin or treating with Xarelto , at time of next OV in 6 months. Patient currently has a CHADS score of 2 (HTN, DM). In the interim, will increase ASA to 325 daily.

## 2012-02-28 NOTE — Patient Instructions (Addendum)
Your physician recommends that you go to the [facility] lab for blood work for BMET & Magnesium level. If the results of your test are normal or stable, you will receive a letter.  If they are abnormal, the nurse will contact you by phone. Increase Aspirin to 325mg  daily Continue all other current medications. Your physician wants you to follow up in: 6 months.  You will receive a reminder letter in the mail one-two months in advance.  If you don't receive a letter, please call our office to schedule the follow up appointment

## 2012-02-28 NOTE — Assessment & Plan Note (Signed)
Well-controlled on current medication regimen 

## 2012-04-09 ENCOUNTER — Encounter: Payer: Self-pay | Admitting: *Deleted

## 2012-06-10 ENCOUNTER — Other Ambulatory Visit: Payer: Self-pay | Admitting: *Deleted

## 2012-06-10 MED ORDER — FLECAINIDE ACETATE 100 MG PO TABS: 100.0000 mg | ORAL_TABLET | Freq: Two times a day (BID) | ORAL | Status: DC

## 2012-09-21 ENCOUNTER — Other Ambulatory Visit: Payer: Self-pay | Admitting: Physician Assistant

## 2012-10-05 ENCOUNTER — Encounter: Payer: Self-pay | Admitting: Cardiology

## 2012-10-05 ENCOUNTER — Ambulatory Visit (INDEPENDENT_AMBULATORY_CARE_PROVIDER_SITE_OTHER): Payer: Medicare Other | Admitting: Cardiology

## 2012-10-05 VITALS — BP 103/68 | HR 74 | Ht 65.0 in | Wt 217.0 lb

## 2012-10-05 DIAGNOSIS — I10 Essential (primary) hypertension: Secondary | ICD-10-CM

## 2012-10-05 DIAGNOSIS — I4891 Unspecified atrial fibrillation: Secondary | ICD-10-CM

## 2012-10-05 NOTE — Patient Instructions (Addendum)

## 2012-10-05 NOTE — Assessment & Plan Note (Signed)
Symptomatically stable, doing well on flecainide, Lopressor, and at this point aspirin. As noted above we did discuss her stroke risk, CHADS2 score of 3, and consideration of starting Xarelto instead of aspirin. We reviewed the pros and cons, and she is in agreement to proceed, although we will hold off until after pending colonoscopy in early February. Her creatinine is 1.1 with GFR 48, will start a 15 mg daily dose. Followup arranged.

## 2012-10-05 NOTE — Progress Notes (Signed)
Clinical Summary Ms. Komm is a 77 y.o.female presenting for followup. She is a former patient of Dr. Dannielle Burn, last seen in April 2012. She states she has done well, had one brief episode of atrial fibrillation over the last year. She has had no cardiac hospitalizations.  ECG today shows sinus rhythm with PR 214 ms, QRS 110 ms, QTC 440 ms, LAFB with poor with progression. She was previously on Coumadin, although this was discontinued by Dr. Dannielle Burn. She did see Mr. Jacqualine Mau in June 2013 at which time resumption of anticoagulation was discussed, possibly with a newer agent. Her CHADS2 score is essentially 3 at this point based on age, hypertension, and diabetes mellitus.  She has not been exercising, we did discuss a walking regimen. 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 She is also raising a 57-year-old granddaughter.   Allergies  Allergen Reactions  . Amoxicillin     REACTION: rash  . Codeine Phosphate     REACTION: vomiting    Current Outpatient Prescriptions  Medication Sig Dispense Refill  . amLODipine (NORVASC) 5 MG tablet Take 5 mg by mouth 2 (two) times daily.       . cetirizine (ZYRTEC) 10 MG tablet Take 10 mg by mouth daily.      Marland Kitchen doxazosin (CARDURA) 4 MG tablet Take 4 mg by mouth at bedtime.       . fexofenadine (ALLEGRA) 180 MG tablet Take 180 mg by mouth daily.      . flecainide (TAMBOCOR) 100 MG tablet TAKE 1 TABLET BY MOUTH TWICE A DAY  60 tablet  1  . gemfibrozil (LOPID) 600 MG tablet Take 600 mg by mouth 2 (two) times daily before a meal.        . glimepiride (AMARYL) 2 MG tablet Take 2 mg by mouth daily before breakfast.       . hydrochlorothiazide 25 MG tablet Take 25 mg by mouth daily.        Marland Kitchen LEVEMIR FLEXPEN 100 UNIT/ML injection Inject 16 Units into the skin daily.       . Liraglutide (VICTOZA [place]) Inject 1.2 Units into the skin daily.      . metformin (FORTAMET) 500 MG (OSM) 24 hr tablet  Take 500 mg by mouth 2 (two) times daily with a meal.       . metoprolol (LOPRESSOR) 50 MG tablet Take 50 mg by mouth 2 (two) times daily.        Marland Kitchen omeprazole (PRILOSEC) 20 MG capsule Take 20 mg by mouth daily.        Marland Kitchen venlafaxine XR (EFFEXOR-XR) 75 MG 24 hr capsule Take 75 mg by mouth daily.       . Vitamin D, Ergocalciferol, (DRISDOL) 50000 UNITS CAPS Take 50,000 Units by mouth every 7 (seven) days.        Past Medical History  Diagnosis Date  . Essential hypertension, benign   . Atrial fibrillation   . Atrial flutter   . GERD (gastroesophageal reflux disease)   . OSA on CPAP     Social History Ms. Macmullen reports that she quit smoking about 34 years ago. Her smoking use included Cigarettes. She does not have any smokeless tobacco history on file. Ms. Ewy reports that she does not drink alcohol.  Review of Systems No reported bleeding episodes, no syncope. No exertional chest pain or unusual breathlessness. Otherwise negative.  Physical Examination Filed Vitals:  10/05/12 0843  BP: 103/68  Pulse: 74   Filed Weights   10/05/12 0843  Weight: 217 lb (98.431 kg)   No acute distress. HEENT: Conjunctiva and lids normal, oropharynx clear. Neck: Supple, no elevated JVP or carotid bruits, no thyromegaly. Lungs: Clear to auscultation, nonlabored breathing at rest. Cardiac: Regular rate and rhythm, no S3 or significant systolic murmur, no pericardial rub. Extremities: No pitting edema, distal pulses 2+. Skin: Warm and dry. Musculoskeletal: No kyphosis. Neuropsychiatric: Alert and oriented x3, affect grossly appropriate.   Problem List and Plan   Atrial fibrillation Symptomatically stable, doing well on flecainide, Lopressor, and at this point aspirin. As noted above we did discuss her stroke risk, CHADS2 score of 3, and consideration of starting Xarelto instead of aspirin. We reviewed the pros and cons, and she is in agreement to proceed, although we will hold off until  after pending colonoscopy in early February. Her creatinine is 1.1 with GFR 48, will start a 15 mg daily dose. Followup arranged.  Essential hypertension, benign Blood pressure well controlled today.    Satira Sark, M.D., F.A.C.C.

## 2012-10-05 NOTE — Assessment & Plan Note (Signed)
Blood pressure well-controlled today. 

## 2012-11-13 ENCOUNTER — Telehealth: Payer: Self-pay | Admitting: Cardiology

## 2012-11-13 NOTE — Telephone Encounter (Signed)
Ms. Lubow called Concerned about not taking ASA anymore. Was a new prescription called in. She was thinking that a new RX was going To be called in for her.

## 2012-11-13 NOTE — Telephone Encounter (Signed)
Left message for patient to call office.  

## 2012-11-16 MED ORDER — RIVAROXABAN 15 MG PO TABS: 15.0000 mg | ORAL_TABLET | Freq: Every day | ORAL | Status: DC

## 2012-11-16 NOTE — Telephone Encounter (Signed)
See my most recent note - can start Xarelto 15 mg daily.

## 2012-11-16 NOTE — Telephone Encounter (Signed)
Patient called to get clarification on whether or not she is to start xarelto or aspirin. Nurse advised patient plan states she can start xarelto 15 mg after colonoscopy. Nurse informed patient that [place] would be sent to her pharmacy.

## 2013-01-22 ENCOUNTER — Other Ambulatory Visit: Payer: Self-pay | Admitting: Physician Assistant

## 2013-05-20 ENCOUNTER — Other Ambulatory Visit: Payer: Self-pay | Admitting: Cardiology

## 2013-05-20 MED ORDER — RIVAROXABAN 15 MG PO TABS: 15.0000 mg | ORAL_TABLET | Freq: Every day | ORAL | Status: DC

## 2013-06-17 ENCOUNTER — Ambulatory Visit (INDEPENDENT_AMBULATORY_CARE_PROVIDER_SITE_OTHER): Payer: Medicare Other | Admitting: Cardiology

## 2013-06-17 ENCOUNTER — Ambulatory Visit: Payer: Medicare Other | Admitting: Cardiology

## 2013-06-17 ENCOUNTER — Encounter: Payer: Self-pay | Admitting: Cardiology

## 2013-06-17 ENCOUNTER — Other Ambulatory Visit: Payer: Self-pay | Admitting: Cardiology

## 2013-06-17 VITALS — BP 117/75 | HR 73 | Ht 65.0 in | Wt 213.4 lb

## 2013-06-17 DIAGNOSIS — I4891 Unspecified atrial fibrillation: Secondary | ICD-10-CM

## 2013-06-17 DIAGNOSIS — I10 Essential (primary) hypertension: Secondary | ICD-10-CM

## 2013-06-17 LAB — CBC
Hemoglobin: 12.8 g/dL (ref 12.0–15.0)
MCHC: 34.3 g/dL (ref 30.0–36.0)
Platelets: 266 10*3/uL (ref 150–400)
RDW: 13.3 % (ref 11.5–15.5)

## 2013-06-17 LAB — BASIC METABOLIC PANEL
Calcium: 9.2 mg/dL (ref 8.4–10.5)
Potassium: 3.4 mEq/L — ABNORMAL LOW (ref 3.5–5.3)
Sodium: 136 mEq/L (ref 135–145)

## 2013-06-17 NOTE — Assessment & Plan Note (Signed)
Blood pressure control is good today. 

## 2013-06-17 NOTE — Patient Instructions (Addendum)
Your physician recommends that you schedule a follow-up appointment in: 6 months. You will receive a reminder letter in the mail in about 4 months reminding you to call and schedule your appointment. If you don't receive this letter, please contact our office. Your physician recommends that you continue on your current medications as directed. Please refer to the Current Medication list given to you today. Your physician recommends that you have lab work a BMET and CBC. [facility]                      [address] (across from [employer]) M-F 7:00 am to 6:00 pm  Sat 8:00 am to 12:00

## 2013-06-17 NOTE — Assessment & Plan Note (Signed)
Symptomatically well controlled. She remains on flecainide, Lopressor, and Xarelto. Followup BMET and CBC. Anticipate 6 month visit.

## 2013-06-17 NOTE — Progress Notes (Signed)
Clinical Summary [name] is a 77 y.o.female last seen in January. She was started on Xarelto in the interim. She states she has done well, no obvious bleeding problems, no palpitations or chest pain.  No followup lab work as yet per primary care. She otherwise reports compliance with her medications.   Allergies  Allergen Reactions  . Amoxicillin     REACTION: rash  . Codeine Phosphate     REACTION: vomiting    Current Outpatient Prescriptions  Medication Sig Dispense Refill  . amLODipine (NORVASC) 10 MG tablet Take 10 mg by mouth daily.      . cetirizine (ZYRTEC) 10 MG tablet Take 10 mg by mouth every morning.       Marland Kitchen doxazosin (CARDURA) 8 MG tablet Take 4 mg by mouth at bedtime.      . fexofenadine (ALLEGRA) 180 MG tablet Take 180 mg by mouth every evening.       . flecainide (TAMBOCOR) 100 MG tablet TAKE 1 TABLET BY MOUTH TWICE A DAY  60 tablet  6  . gemfibrozil (LOPID) 600 MG tablet Take 600 mg by mouth 2 (two) times daily before a meal.        . glimepiride (AMARYL) 4 MG tablet Take 4 mg by mouth 2 (two) times daily.      . hydrochlorothiazide 25 MG tablet Take 25 mg by mouth daily.        Marland Kitchen LEVEMIR FLEXPEN 100 UNIT/ML injection Inject 16 Units into the skin daily.       . Liraglutide (VICTOZA [place]) Inject 1.2 Units into the skin daily.      . metformin (FORTAMET) 500 MG (OSM) 24 hr tablet Take 500 mg by mouth 2 (two) times daily with a meal.       . metoprolol (LOPRESSOR) 50 MG tablet Take 50 mg by mouth 2 (two) times daily.        Marland Kitchen omeprazole (PRILOSEC) 20 MG capsule Take 20 mg by mouth daily.        . Rivaroxaban (XARELTO) 15 MG TABS tablet Take 1 tablet (15 mg total) by mouth daily.  30 tablet  5  . venlafaxine XR (EFFEXOR-XR) 75 MG 24 hr capsule Take 75 mg by mouth daily.        No current facility-administered medications for this visit.    Past Medical History  Diagnosis Date  . Essential hypertension, benign   . Atrial fibrillation   . Atrial flutter   .  GERD (gastroesophageal reflux disease)   . OSA on CPAP     Social History Ms. Cedillos reports that she quit smoking about 34 years ago. Her smoking use included Cigarettes. She smoked 0.00 packs per day. She does not have any smokeless tobacco history on file. Ms. Leistikow reports that she does not drink alcohol.  Review of Systems Negative except as outlined.  Physical Examination Filed Vitals:   06/17/13 1106  BP: 117/75  Pulse: 73   Filed Weights   06/17/13 1106  Weight: 213 lb 6.4 oz (96.798 kg)    No acute distress.  HEENT: Conjunctiva and lids normal, oropharynx clear.  Neck: Supple, no elevated JVP or carotid bruits, no thyromegaly.  Lungs: Clear to auscultation, nonlabored breathing at rest.  Cardiac: Regular rate and rhythm, no S3 or significant systolic murmur, no pericardial rub.  Extremities: No pitting edema, distal pulses 2+.  Skin: Warm and dry.  Musculoskeletal: No kyphosis.  Neuropsychiatric: Alert and oriented x3, affect grossly appropriate.  Problem List and Plan   Atrial fibrillation Symptomatically well controlled. She remains on flecainide, Lopressor, and Xarelto. Followup BMET and CBC. Anticipate 6 month visit.  Essential hypertension, benign Blood pressure control is good today.    Satira Sark, M.D., F.A.C.C.

## 2013-06-18 ENCOUNTER — Telehealth: Payer: Self-pay | Admitting: Cardiology

## 2013-06-18 NOTE — Telephone Encounter (Signed)
Message copied by Lewayne Bunting on Fri Jun 18, 2013  3:15 PM ------      Message from: Satira Sark      Created: Fri Jun 18, 2013  7:52 AM       Reviewed. Hemoglobin normal at 12.8 and creatinine normal at 0.99. Please let her know that glucose was significantly elevated, needs to keep followup with primary care to work on glucose control. ------

## 2013-06-18 NOTE — Telephone Encounter (Signed)
Pt informed and labs routed to PCP

## 2013-07-08 ENCOUNTER — Telehealth: Payer: Self-pay | Admitting: Cardiology

## 2013-07-08 NOTE — Telephone Encounter (Signed)
[name] went to dentist 07-07-13 to have a root canal . The dentist was unable to save the tooth. Mrs. Garvie states That she is having a lot of bruising. Wants to know if this is coming from the Xarelto 15 mg . Please call her cell #

## 2013-07-08 NOTE — Telephone Encounter (Signed)
Patient is planning to have a tooth extraction next week and her dentist is requesting if she can hold the xarelto 1 week prior. Patient said its okay to leave response on answer machine.

## 2013-07-08 NOTE — Telephone Encounter (Signed)
Yes she can hold Xarelto temporarily for dental work. Probably only needs to hold for > 24 hours prior to procedure.

## 2013-07-09 NOTE — Telephone Encounter (Signed)
Patient informed via vm. 

## 2013-07-29 ENCOUNTER — Other Ambulatory Visit: Payer: Self-pay

## 2013-09-22 ENCOUNTER — Other Ambulatory Visit: Payer: Self-pay | Admitting: Physician Assistant

## 2014-02-23 ENCOUNTER — Ambulatory Visit (INDEPENDENT_AMBULATORY_CARE_PROVIDER_SITE_OTHER): Payer: Medicare Other | Admitting: Cardiology

## 2014-02-23 ENCOUNTER — Encounter: Payer: Self-pay | Admitting: Cardiology

## 2014-02-23 VITALS — BP 147/83 | HR 67 | Ht 65.0 in | Wt 215.4 lb

## 2014-02-23 DIAGNOSIS — I4891 Unspecified atrial fibrillation: Secondary | ICD-10-CM

## 2014-02-23 DIAGNOSIS — I4892 Unspecified atrial flutter: Secondary | ICD-10-CM

## 2014-02-23 DIAGNOSIS — I10 Essential (primary) hypertension: Secondary | ICD-10-CM

## 2014-02-23 NOTE — Progress Notes (Signed)
Clinical Summary Ms. Pilz is a 78 y.o.female last seen in September 2014. She reports no palpitations or chest pain. She has joined the [employer] and plans to start a basic exercise regimen, walking on the treadmill and using an exercise bicycle. We talked about this today and I told her to begin her exercise gradually. She also tells me that she has been sad due to the passing of her 76 year old poodle.  Lab work from September 2014 showed hemoglobin 12.8, platelets 266, potassium 3.4, BUN 15, creatinine 0.9.  ECG today shows sinus rhythm with poor R wave progression and leftward axis, nonspecific T-wave changes. Normal QRS.  Allergies  Allergen Reactions  . Amoxicillin     REACTION: rash  . Codeine Phosphate     REACTION: vomiting    Current Outpatient Prescriptions  Medication Sig Dispense Refill  . amLODipine (NORVASC) 10 MG tablet Take 10 mg by mouth daily.      . cetirizine (ZYRTEC) 10 MG tablet Take 10 mg by mouth as needed.       . doxazosin (CARDURA) 8 MG tablet Take 4 mg by mouth at bedtime.      . fexofenadine (ALLEGRA) 180 MG tablet Take 180 mg by mouth as needed.       . flecainide (TAMBOCOR) 100 MG tablet TAKE 1 TABLET BY MOUTH TWICE A DAY  60 tablet  6  . gemfibrozil (LOPID) 600 MG tablet Take 600 mg by mouth 2 (two) times daily before a meal.        . glimepiride (AMARYL) 4 MG tablet Take 4 mg by mouth 2 (two) times daily.      . hydrochlorothiazide 25 MG tablet Take 25 mg by mouth daily.        Marland Kitchen LEVEMIR FLEXPEN 100 UNIT/ML injection Inject 26 Units into the skin daily.       . Liraglutide (VICTOZA [place]) Inject 1.2 Units into the skin daily.      . metformin (FORTAMET) 500 MG (OSM) 24 hr tablet Take 500 mg by mouth 2 (two) times daily with a meal.       . metoprolol (LOPRESSOR) 50 MG tablet Take 50 mg by mouth 2 (two) times daily.        Marland Kitchen omeprazole (PRILOSEC) 20 MG capsule Take 20 mg by mouth daily.        . Rivaroxaban (XARELTO) 15 MG TABS tablet Take 1 tablet (15  mg total) by mouth daily.  30 tablet  5  . venlafaxine XR (EFFEXOR-XR) 75 MG 24 hr capsule Take 75 mg by mouth daily.        No current facility-administered medications for this visit.    Past Medical History  Diagnosis Date  . Essential hypertension, benign   . Atrial fibrillation   . Atrial flutter   . GERD (gastroesophageal reflux disease)   . OSA on CPAP     Social History Ms. Ebrahim reports that she quit smoking about 35 years ago. Her smoking use included Cigarettes. She smoked 0.00 packs per day. She does not have any smokeless tobacco history on file. Ms. Oberly reports that she does not drink alcohol.  Review of Systems No chest pain or unusual shortness of breath. No bleeding problems. No significant palpitations. Otherwise as outlined.  Physical Examination Filed Vitals:   02/23/14 1057  BP: 147/83  Pulse: 67   Filed Weights   02/23/14 1057  Weight: 215 lb 6.4 oz (97.705 kg)    No acute distress.  HEENT: Conjunctiva and lids normal, oropharynx clear.  Neck: Supple, no elevated JVP or carotid bruits, no thyromegaly.  Lungs: Clear to auscultation, nonlabored breathing at rest.  Cardiac: Regular rate and rhythm, no S3 or significant systolic murmur, no pericardial rub.  Extremities: No pitting edema, distal pulses 2+.    Problem List and Plan   Atrial fibrillation Symptomatically well controlled, continue flecainide and Xarelto.  Essential hypertension, benign No changes to current regimen, keep followup with Dr. Woody Seller.    Satira Sark, M.D., F.A.C.C.

## 2014-02-23 NOTE — Patient Instructions (Signed)

## 2014-02-23 NOTE — Assessment & Plan Note (Signed)
No changes to current regimen, keep followup with Dr. Woody Seller.

## 2014-02-23 NOTE — Assessment & Plan Note (Signed)
Symptomatically well controlled, continue flecainide and Xarelto.

## 2014-06-02 ENCOUNTER — Other Ambulatory Visit: Payer: Self-pay | Admitting: Cardiology

## 2014-06-03 ENCOUNTER — Other Ambulatory Visit: Payer: Self-pay | Admitting: Cardiology

## 2014-06-03 NOTE — Telephone Encounter (Signed)
[place] pt. 

## 2014-07-28 ENCOUNTER — Ambulatory Visit (INDEPENDENT_AMBULATORY_CARE_PROVIDER_SITE_OTHER): Payer: Medicare Other | Admitting: Cardiology

## 2014-07-28 ENCOUNTER — Encounter: Payer: Self-pay | Admitting: Cardiology

## 2014-07-28 VITALS — BP 114/72 | HR 59 | Ht 65.0 in | Wt 216.0 lb

## 2014-07-28 DIAGNOSIS — Z79899 Other long term (current) drug therapy: Secondary | ICD-10-CM

## 2014-07-28 DIAGNOSIS — I10 Essential (primary) hypertension: Secondary | ICD-10-CM

## 2014-07-28 DIAGNOSIS — I480 Paroxysmal atrial fibrillation: Secondary | ICD-10-CM

## 2014-07-28 DIAGNOSIS — Z5181 Encounter for therapeutic drug level monitoring: Secondary | ICD-10-CM

## 2014-07-28 NOTE — Assessment & Plan Note (Signed)
Paroxysmal, maintaining sinus rhythm. Plan to continue flecainide, Lopressor, and Xarelto. We will obtain follow-up CBC, BMET, and flecainide level. QRS duration 116 ms.

## 2014-07-28 NOTE — Patient Instructions (Addendum)
Your physician recommends that you schedule a follow-up appointment in: 6 months. You will receive a reminder letter in the mail in about 4 months reminding you to call and schedule your appointment. If you don't receive this letter, please contact our office. Your physician recommends that you continue on your current medications as directed. Please refer to the Current Medication list given to you today. Your physician recommends that you have lab work to check your BMET, CBC, and a trough flecainide level. Please have this done just before your dose is due.

## 2014-07-28 NOTE — Progress Notes (Signed)
Reason for visit: Atrial arrhythmias, hypertension  Clinical Summary Ms. Fulwood is a 78 y.o.female last seen in June. She has been doing well from a cardiac perspective without any significant palpitations or obvious breakthrough atrial fibrillation. She continues on flecainide, Lopressor, and Xarelto. She remains functional in her ADLs without exertional chest pain, and stable NYHA class II dyspnea.  Lab work from last year showed hemoglobin 12.8, platelets 266, BUN 15, and creatinine 0.9. We discussed obtaining follow-up lab work.  ECG today shows sinus rhythm with left anterior fascicular block, decreased R wave progression, and QRS duration 116 ms. Last tracing showed QRS duration 106 ms.   Allergies  Allergen Reactions  . Amoxicillin     REACTION: rash  . Codeine Phosphate     REACTION: vomiting    Current Outpatient Prescriptions  Medication Sig Dispense Refill  . amLODipine (NORVASC) 10 MG tablet Take 10 mg by mouth daily.    . cetirizine (ZYRTEC) 10 MG tablet Take 10 mg by mouth as needed.     . doxazosin (CARDURA) 8 MG tablet Take 4 mg by mouth at bedtime.    . fexofenadine (ALLEGRA) 180 MG tablet Take 180 mg by mouth as needed.     . flecainide (TAMBOCOR) 100 MG tablet TAKE 1 TABLET BY MOUTH TWICE A DAY 60 tablet 3  . gemfibrozil (LOPID) 600 MG tablet Take 600 mg by mouth 2 (two) times daily before a meal.      . glimepiride (AMARYL) 4 MG tablet Take 4 mg by mouth 2 (two) times daily.    . hydrochlorothiazide 25 MG tablet Take 25 mg by mouth daily.      Marland Kitchen LEVEMIR FLEXPEN 100 UNIT/ML injection Inject 26 Units into the skin daily.     . metformin (FORTAMET) 500 MG (OSM) 24 hr tablet Take 500 mg by mouth 2 (two) times daily with a meal.     . metoprolol (LOPRESSOR) 50 MG tablet Take 50 mg by mouth 2 (two) times daily.      Marland Kitchen omeprazole (PRILOSEC) 20 MG capsule Take 20 mg by mouth daily.      . Rivaroxaban (XARELTO) 15 MG TABS tablet Take 1 tablet (15 mg total) by mouth  daily. 30 tablet 5  . venlafaxine XR (EFFEXOR-XR) 75 MG 24 hr capsule Take 75 mg by mouth daily.      No current facility-administered medications for this visit.    Past Medical History  Diagnosis Date  . Essential hypertension, benign   . Atrial fibrillation   . Atrial flutter   . GERD (gastroesophageal reflux disease)   . OSA on CPAP     Social History Ms. Nichelson reports that she quit smoking about 35 years ago. Her smoking use included Cigarettes. She started smoking about 57 years ago. She smoked 0.00 packs per day. She does not have any smokeless tobacco history on file. Ms. Leazer reports that she does not drink alcohol.  Review of Systems Complete review of systems negative except as otherwise outlined in the clinical summary and also the following.  Physical Examination Filed Vitals:   07/28/14 1136  BP: 114/72  Pulse: 59   Filed Weights   07/28/14 1136  Weight: 216 lb (97.977 kg)    Appears comfortable at rest. HEENT: Conjunctiva and lids normal, oropharynx clear.  Neck: Supple, no elevated JVP or carotid bruits, no thyromegaly.  Lungs: Clear to auscultation, nonlabored breathing at rest.  Cardiac: Regular rate and rhythm, no S3 or significant systolic  murmur, no pericardial rub.  Extremities: No pitting edema, distal pulses 2+.    Problem List and Plan   Atrial fibrillation Paroxysmal, maintaining sinus rhythm. Plan to continue flecainide, Lopressor, and Xarelto. We will obtain follow-up CBC, BMET, and flecainide level. QRS duration 116 ms.  Essential hypertension, benign Blood pressure is normal today. No changes made.    Satira Sark, M.D., F.A.C.C.

## 2014-07-28 NOTE — Assessment & Plan Note (Signed)
Blood pressure is normal today. No changes made. 

## 2014-07-29 ENCOUNTER — Other Ambulatory Visit: Payer: Self-pay | Admitting: Cardiology

## 2014-08-03 ENCOUNTER — Telehealth: Payer: Self-pay | Admitting: *Deleted

## 2014-08-03 NOTE — Telephone Encounter (Signed)
-----   Message from Satira Sark, MD sent at 07/30/2014  3:54 PM EST ----- Reviewed. Hemoglobin and creatinine remain normal. Potassium is low at 3.2. Since she is on a diuretic, probably needs to be on KCl 10 mEq daily.

## 2014-08-04 LAB — FLECAINIDE LEVEL: Flecainide: 0.64 ug/mL (ref 0.20–1.00)

## 2014-08-05 MED ORDER — POTASSIUM CHLORIDE ER 10 MEQ PO TBCR: 10.0000 meq | EXTENDED_RELEASE_TABLET | Freq: Every day | ORAL | Status: DC

## 2014-08-05 NOTE — Telephone Encounter (Signed)
-----   Message from Satira Sark, MD sent at 08/05/2014  7:40 AM EST ----- Flecainide level in normal range. Continue current dose.

## 2014-08-05 NOTE — Telephone Encounter (Signed)
Patient informed and verbalized understanding of plan. 

## 2014-08-17 ENCOUNTER — Ambulatory Visit: Payer: Medicare Other | Admitting: Cardiology

## 2014-09-26 ENCOUNTER — Telehealth: Payer: Self-pay | Admitting: Cardiology

## 2014-09-26 NOTE — Telephone Encounter (Signed)
Brown color blood has been found when she brushes her teeth and also having brown color discharge  She is taking Lauree Chandler

## 2014-09-26 NOTE — Telephone Encounter (Signed)
Noticed twice this weekend - rust color when brushing teeth.  Same thing with panty liner x 1 episode.   Is currently on Xarelto.  Both areas are better now.  No other complaints.   Informed patient that message will be sent to provider for any further suggestions.

## 2014-09-27 NOTE — Telephone Encounter (Signed)
Noted. Please have her keep an eye on this. Lab work done in November showed normal hemoglobin and renal function, no clear indication to change dosage of  Xarelto now. Continue to follow.

## 2014-10-11 ENCOUNTER — Other Ambulatory Visit: Payer: Self-pay | Admitting: Cardiology

## 2014-10-11 NOTE — Telephone Encounter (Signed)
Patient informed. 

## 2015-01-30 ENCOUNTER — Encounter: Payer: Self-pay | Admitting: Cardiology

## 2015-01-30 ENCOUNTER — Ambulatory Visit (INDEPENDENT_AMBULATORY_CARE_PROVIDER_SITE_OTHER): Payer: Medicare Other | Admitting: Cardiology

## 2015-01-30 VITALS — BP 133/80 | HR 52 | Ht 65.0 in | Wt 225.8 lb

## 2015-01-30 DIAGNOSIS — I10 Essential (primary) hypertension: Secondary | ICD-10-CM | POA: Diagnosis not present

## 2015-01-30 DIAGNOSIS — I480 Paroxysmal atrial fibrillation: Secondary | ICD-10-CM

## 2015-01-30 NOTE — Progress Notes (Signed)
Cardiology Office Note  Date: 01/30/2015   ID: [name], DOB 22-May-1936, MRN [id number]  PCP: Glenda Chroman., MD  Primary Cardiologist: Rozann Lesches, MD   Chief Complaint  Patient presents with  . Atrial arrhythmias    History of Present Illness: [name] is a 79 y.o. female last seen in November 2015. She presents for a routine follow-up visit. Reports no palpitations or chest pain. States that she spends a lot of time doing things with her 76 year old granddaughter. Otherwise no regular exercise plan.  She continues on flecainide, Lopressor, and Xarelto. She reports no bleeding episodes.  Follow-up ECG is reviewed below. She will be seeing Dr. Woody Seller for a physical soon as well.  Past Medical History  Diagnosis Date  . Essential hypertension, benign   . Atrial fibrillation   . Atrial flutter   . GERD (gastroesophageal reflux disease)   . OSA on CPAP     Current Outpatient Prescriptions  Medication Sig Dispense Refill  . amLODipine (NORVASC) 10 MG tablet Take 10 mg by mouth daily.    Marland Kitchen doxazosin (CARDURA) 8 MG tablet Take 4 mg by mouth at bedtime.    . flecainide (TAMBOCOR) 100 MG tablet TAKE 1 TABLET BY MOUTH TWICE A DAY 60 tablet 6  . gemfibrozil (LOPID) 600 MG tablet Take 600 mg by mouth 2 (two) times daily before a meal.      . glimepiride (AMARYL) 4 MG tablet Take 4 mg by mouth 2 (two) times daily.    . hydrochlorothiazide 25 MG tablet Take 25 mg by mouth daily.      . insulin aspart (NOVOLOG) 100 UNIT/ML FlexPen Inject 10 Units into the skin 2 (two) times daily.    . Insulin Glargine (TOUJEO SOLOSTAR [place]) Inject 38 Units into the skin at bedtime.    . metformin (FORTAMET) 500 MG (OSM) 24 hr tablet Take 500 mg by mouth 2 (two) times daily with a meal.     . metoprolol (LOPRESSOR) 50 MG tablet Take 50 mg by mouth 2 (two) times daily.      Marland Kitchen omeprazole (PRILOSEC) 20 MG capsule Take 20 mg by mouth daily.      . potassium chloride (K-DUR) 10 MEQ tablet Take  1 tablet (10 mEq total) by mouth daily. 30 tablet 6  . Rivaroxaban (XARELTO) 15 MG TABS tablet Take 1 tablet (15 mg total) by mouth daily. 30 tablet 5  . venlafaxine XR (EFFEXOR-XR) 75 MG 24 hr capsule Take 75 mg by mouth daily.      No current facility-administered medications for this visit.    Allergies:  Amoxicillin and Codeine phosphate   Social History: The patient  reports that she quit smoking about 36 years ago. Her smoking use included Cigarettes. She started smoking about 58 years ago. She does not have any smokeless tobacco history on file. She reports that she does not drink alcohol or use illicit drugs.    ROS:  Please see the history of present illness. Otherwise, complete review of systems is positive for none.  All other systems are reviewed and negative.   Physical Exam: VS:  BP 133/80 mmHg  Pulse 52  Ht 5\' 5"  (1.651 m)  Wt 225 lb 12.8 oz (102.422 kg)  BMI 37.58 kg/m2  SpO2 93%, BMI Body mass index is 37.58 kg/(m^2).  Wt Readings from Last 3 Encounters:  01/30/15 225 lb 12.8 oz (102.422 kg)  07/28/14 216 lb (97.977 kg)  02/23/14 215 lb 6.4 oz (  97.705 kg)     Appears comfortable at rest. HEENT: Conjunctiva and lids normal, oropharynx clear.  Neck: Supple, no elevated JVP or carotid bruits, no thyromegaly.  Lungs: Clear to auscultation, nonlabored breathing at rest.  Cardiac: Regular rate and rhythm, no S3 or significant systolic murmur, no pericardial rub.  Extremities: No pitting edema, distal pulses 2+.    ECG: ECG is ordered today and reviewed showing sinus bradycardia with PR prolongation, left anterior fascicular block, QRS 114 ms.   Recent Labwork:  08/05/2014: Flecainide level 0.64 (normal range), BUN 19, creatinine 1.0, potassium 3.2, hemoglobin 13.1, platelets 253.   Assessment and Plan:  1. Paroxysmal atrial arrhythmias as outlined above, clinically stable on current medical regimen. ECG reviewed. No changes were made today.  2. Essential  hypertension, blood pressure is stable.  Current medicines were reviewed with the patient today.   Orders Placed This Encounter  Procedures  . EKG 12-Lead    Disposition: FU with me in 6 months.   Signed, Satira Sark, MD, [facility] 01/30/2015 11:45 AM    [facility] [place], [place], [place] [address] Phone: [phone]; Fax: [phone]

## 2015-01-30 NOTE — Patient Instructions (Signed)
Your physician recommends that you continue on your current medications as directed. Please refer to the Current Medication list given to you today. Your physician recommends that you schedule a follow-up appointment in: 6 months. You will receive a reminder letter in the mail in about 4 months reminding you to call and schedule your appointment. If you don't receive this letter, please contact our office. 

## 2015-03-08 ENCOUNTER — Other Ambulatory Visit: Payer: Self-pay | Admitting: Cardiology

## 2015-04-10 ENCOUNTER — Other Ambulatory Visit: Payer: Self-pay | Admitting: *Deleted

## 2015-04-10 MED ORDER — RIVAROXABAN 15 MG PO TABS: 15.0000 mg | ORAL_TABLET | Freq: Every day | ORAL | Status: DC

## 2015-04-11 ENCOUNTER — Other Ambulatory Visit: Payer: Self-pay | Admitting: *Deleted

## 2015-04-11 MED ORDER — RIVAROXABAN 15 MG PO TABS: 15.0000 mg | ORAL_TABLET | Freq: Every day | ORAL | Status: DC

## 2015-04-11 NOTE — Telephone Encounter (Signed)
Xarelto refilled today.

## 2015-05-06 ENCOUNTER — Other Ambulatory Visit: Payer: Self-pay | Admitting: Cardiology

## 2015-05-10 ENCOUNTER — Telehealth: Payer: Self-pay | Admitting: Cardiology

## 2015-05-10 NOTE — Telephone Encounter (Signed)
LM for pt to return call and will provide samples

## 2015-05-10 NOTE — Telephone Encounter (Signed)
Patient is in the doughnut hole with her ins. She is wondering if she can get some Xalreto samples

## 2015-05-10 NOTE — Telephone Encounter (Signed)
Pt will come to office tomorrow for samples. Lot # [id number] Exp. 2/17 3 boxes

## 2015-08-14 ENCOUNTER — Ambulatory Visit (INDEPENDENT_AMBULATORY_CARE_PROVIDER_SITE_OTHER): Payer: Medicare Other | Admitting: Cardiology

## 2015-08-14 ENCOUNTER — Encounter: Payer: Self-pay | Admitting: Cardiology

## 2015-08-14 VITALS — BP 130/70 | HR 62 | Ht 65.0 in | Wt 225.0 lb

## 2015-08-14 DIAGNOSIS — Z7901 Long term (current) use of anticoagulants: Secondary | ICD-10-CM

## 2015-08-14 DIAGNOSIS — I10 Essential (primary) hypertension: Secondary | ICD-10-CM

## 2015-08-14 DIAGNOSIS — I480 Paroxysmal atrial fibrillation: Secondary | ICD-10-CM | POA: Diagnosis not present

## 2015-08-14 DIAGNOSIS — Z5181 Encounter for therapeutic drug level monitoring: Secondary | ICD-10-CM

## 2015-08-14 NOTE — Progress Notes (Signed)
Cardiology Office Note  Date: 08/14/2015   ID: [name], DOB 05/28/1936, MRN [id number]  PCP: Glenda Chroman., MD  Primary Cardiologist: Rozann Lesches, MD   Chief Complaint  Patient presents with  . Atrial arrhythmias    History of Present Illness: [name] is a 79 y.o. female last seen in May. She presents for a routine cardiac visit. Reports only occasional palpitations, nothing prolonged or progressive. No chest pain with exertion.  She reports compliance with her medications which are outlined below. States that her gums bleed occasionally, but she has a history of periodontal disease and follows for regular dental evaluations. She continues on Xarelto 15 mg daily.  She is due for follow-up labwork.   Past Medical History  Diagnosis Date  . Essential hypertension, benign   . Atrial fibrillation ([place])   . Atrial flutter ([place])   . GERD (gastroesophageal reflux disease)   . OSA on CPAP     Current Outpatient Prescriptions  Medication Sig Dispense Refill  . amLODipine (NORVASC) 10 MG tablet Take 10 mg by mouth daily.    Marland Kitchen doxazosin (CARDURA) 8 MG tablet Take 4 mg by mouth at bedtime.    . flecainide (TAMBOCOR) 100 MG tablet TAKE 1 TABLET BY MOUTH TWICE A DAY 60 tablet 6  . gemfibrozil (LOPID) 600 MG tablet Take 600 mg by mouth 2 (two) times daily before a meal.      . glimepiride (AMARYL) 4 MG tablet Take 4 mg by mouth 2 (two) times daily.    . hydrochlorothiazide 25 MG tablet Take 25 mg by mouth daily.      . insulin aspart (NOVOLOG) 100 UNIT/ML FlexPen Inject 10 Units into the skin 2 (two) times daily.    . Insulin Glargine (TOUJEO SOLOSTAR [place]) Inject 44 Units into the skin at bedtime.     Marland Kitchen KLOR-CON 10 10 MEQ tablet TAKE 1 TABLET (10 MEQ TOTAL) BY MOUTH DAILY. 30 tablet 6  . metformin (FORTAMET) 500 MG (OSM) 24 hr tablet Take 500 mg by mouth 2 (two) times daily with a meal.     . metoprolol (LOPRESSOR) 50 MG tablet Take 50 mg by mouth 2 (two) times  daily.      Marland Kitchen omeprazole (PRILOSEC) 20 MG capsule Take 20 mg by mouth daily.      . Rivaroxaban (XARELTO) 15 MG TABS tablet Take 1 tablet (15 mg total) by mouth daily. 30 tablet 6  . venlafaxine XR (EFFEXOR-XR) 75 MG 24 hr capsule Take 75 mg by mouth daily.      No current facility-administered medications for this visit.    Allergies:  Amoxicillin and Codeine phosphate   Social History: The patient  reports that she quit smoking about 36 years ago. Her smoking use included Cigarettes. She started smoking about 59 years ago. She has never used smokeless tobacco. She reports that she does not drink alcohol or use illicit drugs.   ROS:  Please see the history of present illness. Otherwise, complete review of systems is positive for arthritic pains.  All other systems are reviewed and negative.   Physical Exam: VS:  BP 130/70 mmHg  Pulse 62  Ht 5\' 5"  (1.651 m)  Wt 225 lb (102.059 kg)  BMI 37.44 kg/m2  SpO2 94%, BMI Body mass index is 37.44 kg/(m^2).  Wt Readings from Last 3 Encounters:  08/14/15 225 lb (102.059 kg)  01/30/15 225 lb 12.8 oz (102.422 kg)  07/28/14 216 lb (97.977 kg)  Appears comfortable at rest. HEENT: Conjunctiva and lids normal, oropharynx clear.  Neck: Supple, no elevated JVP or carotid bruits, no thyromegaly.  Lungs: Clear to auscultation, nonlabored breathing at rest.  Cardiac: Regular rate and rhythm, no S3 or significant systolic murmur, no pericardial rub.  Extremities: No pitting edema, distal pulses 2+.    ECG:  Tracing from 01/30/2015 showed sinus bradycardia with prolonged PR interval, leftward axis, QRS 114 ms.  Assessment and Plan:  1. Paroxysmal atrial fibrillation, well-controlled on current regimen. We will obtain a follow-up CBC and BMET since she continues on Xarelto. No changes in current regimen.  2. Essential hypertension, blood pressures reasonably well-controlled today.  Current medicines were reviewed with the patient  today.   Orders Placed This Encounter  Procedures  . Basic metabolic panel  . CBC    Disposition: FU with me in 6 months.   Signed, Satira Sark, MD, [facility] 08/14/2015 11:31 AM    [facility] [place], [place], [place] [address] Phone: [phone]; Fax: [phone]

## 2015-08-14 NOTE — Patient Instructions (Signed)
Your physician recommends that you continue on your current medications as directed. Please refer to the Current Medication list given to you today. Your physician recommends that you return for lab work in today to check your BMET & CBC. Your physician recommends that you schedule a follow-up appointment in: 6 months. You will receive a reminder letter in the mail in about 4 months reminding you to call and schedule your appointment. If you don't receive this letter, please contact our office.

## 2015-08-16 ENCOUNTER — Telehealth: Payer: Self-pay | Admitting: *Deleted

## 2015-08-16 NOTE — Telephone Encounter (Signed)
-----   Message from Satira Sark, MD sent at 08/16/2015 10:20 AM EST ----- Reviewed, creatinine 1.0 with GFR 52, potassium 3.6, hemoglobin 13.4, platelets 307. Continue current regimen.

## 2015-08-16 NOTE — Telephone Encounter (Signed)
Patient informed via message machine. 

## 2015-10-04 ENCOUNTER — Other Ambulatory Visit: Payer: Self-pay | Admitting: Cardiology

## 2015-10-18 DIAGNOSIS — L821 Other seborrheic keratosis: Secondary | ICD-10-CM | POA: Diagnosis not present

## 2015-10-18 DIAGNOSIS — D1801 Hemangioma of skin and subcutaneous tissue: Secondary | ICD-10-CM | POA: Diagnosis not present

## 2015-10-18 DIAGNOSIS — D229 Melanocytic nevi, unspecified: Secondary | ICD-10-CM | POA: Diagnosis not present

## 2015-11-09 DIAGNOSIS — E1140 Type 2 diabetes mellitus with diabetic neuropathy, unspecified: Secondary | ICD-10-CM | POA: Diagnosis not present

## 2015-11-09 DIAGNOSIS — E1151 Type 2 diabetes mellitus with diabetic peripheral angiopathy without gangrene: Secondary | ICD-10-CM | POA: Diagnosis not present

## 2015-11-09 DIAGNOSIS — L609 Nail disorder, unspecified: Secondary | ICD-10-CM | POA: Diagnosis not present

## 2015-11-09 DIAGNOSIS — L110 Acquired keratosis follicularis: Secondary | ICD-10-CM | POA: Diagnosis not present

## 2015-11-17 DIAGNOSIS — Z1231 Encounter for screening mammogram for malignant neoplasm of breast: Secondary | ICD-10-CM | POA: Diagnosis not present

## 2015-11-21 DIAGNOSIS — I10 Essential (primary) hypertension: Secondary | ICD-10-CM | POA: Diagnosis not present

## 2015-11-21 DIAGNOSIS — E1165 Type 2 diabetes mellitus with hyperglycemia: Secondary | ICD-10-CM | POA: Diagnosis not present

## 2015-11-30 ENCOUNTER — Other Ambulatory Visit: Payer: Self-pay | Admitting: Cardiology

## 2015-12-29 DIAGNOSIS — E119 Type 2 diabetes mellitus without complications: Secondary | ICD-10-CM | POA: Diagnosis not present

## 2015-12-29 DIAGNOSIS — Z961 Presence of intraocular lens: Secondary | ICD-10-CM | POA: Diagnosis not present

## 2015-12-29 DIAGNOSIS — Z794 Long term (current) use of insulin: Secondary | ICD-10-CM | POA: Diagnosis not present

## 2016-01-05 DIAGNOSIS — M25561 Pain in right knee: Secondary | ICD-10-CM | POA: Diagnosis not present

## 2016-01-05 DIAGNOSIS — M25531 Pain in right wrist: Secondary | ICD-10-CM | POA: Diagnosis not present

## 2016-01-18 DIAGNOSIS — E1140 Type 2 diabetes mellitus with diabetic neuropathy, unspecified: Secondary | ICD-10-CM | POA: Diagnosis not present

## 2016-01-18 DIAGNOSIS — E1151 Type 2 diabetes mellitus with diabetic peripheral angiopathy without gangrene: Secondary | ICD-10-CM | POA: Diagnosis not present

## 2016-01-18 DIAGNOSIS — L110 Acquired keratosis follicularis: Secondary | ICD-10-CM | POA: Diagnosis not present

## 2016-01-18 DIAGNOSIS — L609 Nail disorder, unspecified: Secondary | ICD-10-CM | POA: Diagnosis not present

## 2016-01-26 DIAGNOSIS — M25531 Pain in right wrist: Secondary | ICD-10-CM | POA: Diagnosis not present

## 2016-01-29 DIAGNOSIS — D125 Benign neoplasm of sigmoid colon: Secondary | ICD-10-CM | POA: Diagnosis not present

## 2016-02-06 ENCOUNTER — Encounter: Payer: Self-pay | Admitting: Cardiology

## 2016-02-06 ENCOUNTER — Ambulatory Visit (INDEPENDENT_AMBULATORY_CARE_PROVIDER_SITE_OTHER): Payer: Medicare Other | Admitting: Cardiology

## 2016-02-06 VITALS — BP 128/72 | HR 56 | Ht 65.0 in | Wt 230.0 lb

## 2016-02-06 DIAGNOSIS — I10 Essential (primary) hypertension: Secondary | ICD-10-CM | POA: Diagnosis not present

## 2016-02-06 DIAGNOSIS — I480 Paroxysmal atrial fibrillation: Secondary | ICD-10-CM

## 2016-02-06 NOTE — Progress Notes (Signed)
Cardiology Office Note  Date: 02/06/2016   ID: [name], DOB [birth year], MRN [id number]  PCP: Glenda Chroman, MD  Primary Cardiologist: Rozann Lesches, MD   Chief Complaint  Patient presents with  . Atrial Fibrillation    History of Present Illness: [name] is a 80 y.o. female last seen in November 2016. She presents for a routine follow-up visit. Reports no palpitations or chest pain. She had joined the [facility] to exercise, but ultimately let her membership run out. We discussed trying to get back to a walking plan.  She states that she is scheduled to undergo a screening colonoscopy later this month. She will be off Xarelto for at least 48 hours.  I reviewed her ECG today which shows sinus bradycardia with prolonged PR interval, QRS 106 ms, poor R wave progression and nonspecific T-wave changes.  She is not reporting any bleeding problems on Xarelto. Additional regimen includes flecainide and Lopressor.  Past Medical History  Diagnosis Date  . Essential hypertension, benign   . Atrial fibrillation ([place])   . Atrial flutter ([place])   . GERD (gastroesophageal reflux disease)   . OSA on CPAP     Past Surgical History  Procedure Laterality Date  . Abdominal hysterectomy      Precancerous lesion  . Cholecystectomy      Current Outpatient Prescriptions  Medication Sig Dispense Refill  . amLODipine (NORVASC) 10 MG tablet Take 10 mg by mouth daily.    Marland Kitchen doxazosin (CARDURA) 8 MG tablet Take 4 mg by mouth at bedtime.    . flecainide (TAMBOCOR) 100 MG tablet TAKE 1 TABLET BY MOUTH TWICE A DAY 60 tablet 6  . gemfibrozil (LOPID) 600 MG tablet Take 600 mg by mouth 2 (two) times daily before a meal.      . glimepiride (AMARYL) 4 MG tablet Take 4 mg by mouth 2 (two) times daily.    . hydrochlorothiazide 25 MG tablet Take 25 mg by mouth daily.      . insulin aspart (NOVOLOG) 100 UNIT/ML FlexPen Inject 10 Units into the skin 2 (two) times daily.    . Insulin Glargine (TOUJEO  SOLOSTAR [place]) Inject 44 Units into the skin at bedtime.     Marland Kitchen KLOR-CON 10 10 MEQ tablet TAKE 1 TABLET BY MOUTH EVERY DAY 30 tablet 6  . metformin (FORTAMET) 500 MG (OSM) 24 hr tablet Take 500 mg by mouth 2 (two) times daily with a meal.     . metoprolol (LOPRESSOR) 50 MG tablet Take 50 mg by mouth 2 (two) times daily.      Marland Kitchen omeprazole (PRILOSEC) 20 MG capsule Take 20 mg by mouth daily.      . Rivaroxaban (XARELTO) 15 MG TABS tablet Take 1 tablet (15 mg total) by mouth daily. 30 tablet 6  . venlafaxine XR (EFFEXOR-XR) 75 MG 24 hr capsule Take 75 mg by mouth daily.      No current facility-administered medications for this visit.   Allergies:  Amoxicillin and Codeine phosphate   Social History: The patient  reports that she quit smoking about 37 years ago. Her smoking use included Cigarettes. She started smoking about 59 years ago. She has never used smokeless tobacco. She reports that she does not drink alcohol or use illicit drugs.   ROS:  Please see the history of present illness. Otherwise, complete review of systems is positive for chronic back pain.  All other systems are reviewed and negative.   Physical Exam:  VS:  BP 128/72 mmHg  Pulse 56  Ht 5\' 5"  (1.651 m)  Wt 230 lb (104.327 kg)  BMI 38.27 kg/m2  SpO2 94%, BMI Body mass index is 38.27 kg/(m^2).  Wt Readings from Last 3 Encounters:  02/06/16 230 lb (104.327 kg)  08/14/15 225 lb (102.059 kg)  01/30/15 225 lb 12.8 oz (102.422 kg)    Appears comfortable at rest. HEENT: Conjunctiva and lids normal, oropharynx clear.  Neck: Supple, no elevated JVP or carotid bruits, no thyromegaly.  Lungs: Clear to auscultation, nonlabored breathing at rest.  Cardiac: Regular rate and rhythm, no S3 or significant systolic murmur, no pericardial rub.  Extremities: No pitting edema, distal pulses 2+. Wearing brace on right wrist Skin: Warm and dry. Musculoskeletal: No kyphosis. Neuropsychiatric: Alert and oriented 3, affect  appropriate.  ECG: I personally reviewed the tracing from 01/30/2015 which showed sinus bradycardia with prolonged PR interval and leftward axis.  Recent Labwork:  November 2016: BUN 22, creatinine 1.0, potassium 3.6, hemoglobin 13.4, platelets 307  Assessment and Plan:  1. Paroxysmal atrial fibrillation, well controlled on present regimen including flecainide, Lopressor, and Xarelto. She is due for follow-up CBC and BMET.  2. Essential hypertension, blood pressure is adequately controlled today.  Current medicines were reviewed with the patient today.   Orders Placed This Encounter  Procedures  . EKG 12-Lead    Disposition: FU with me in 6 months.   Signed, Satira Sark, MD, [facility] 02/06/2016 11:47 AM    [facility] [place] [facility] [place], [place], [place] [address] Phone: [phone]; Fax: [phone]

## 2016-02-06 NOTE — Patient Instructions (Signed)
Your physician recommends that you continue on your current medications as directed. Please refer to the Current Medication list given to you today. Your physician recommends that you return for lab work in to check your BMET and CBC. Your physician recommends that you schedule a follow-up appointment in: 6 months. You will receive a reminder letter in the mail in about 4 months reminding you to call and schedule your appointment. If you don't receive this letter, please contact our office.

## 2016-02-12 ENCOUNTER — Telehealth: Payer: Self-pay | Admitting: *Deleted

## 2016-02-12 NOTE — Telephone Encounter (Signed)
Patient informed. 

## 2016-02-12 NOTE — Telephone Encounter (Signed)
-----   Message from Satira Sark, MD sent at 02/08/2016  9:02 AM EDT ----- Reviewed. Creatinine remains normal at 1.1 and hemoglobin is normal at 13.1. No change in current regimen.

## 2016-02-15 DIAGNOSIS — Z794 Long term (current) use of insulin: Secondary | ICD-10-CM | POA: Diagnosis not present

## 2016-02-15 DIAGNOSIS — Z90710 Acquired absence of both cervix and uterus: Secondary | ICD-10-CM | POA: Diagnosis not present

## 2016-02-15 DIAGNOSIS — Z9109 Other allergy status, other than to drugs and biological substances: Secondary | ICD-10-CM | POA: Diagnosis not present

## 2016-02-15 DIAGNOSIS — Z886 Allergy status to analgesic agent status: Secondary | ICD-10-CM | POA: Diagnosis not present

## 2016-02-15 DIAGNOSIS — Z79899 Other long term (current) drug therapy: Secondary | ICD-10-CM | POA: Diagnosis not present

## 2016-02-15 DIAGNOSIS — Z9851 Tubal ligation status: Secondary | ICD-10-CM | POA: Diagnosis not present

## 2016-02-15 DIAGNOSIS — E669 Obesity, unspecified: Secondary | ICD-10-CM | POA: Diagnosis not present

## 2016-02-15 DIAGNOSIS — F17210 Nicotine dependence, cigarettes, uncomplicated: Secondary | ICD-10-CM | POA: Diagnosis not present

## 2016-02-15 DIAGNOSIS — K219 Gastro-esophageal reflux disease without esophagitis: Secondary | ICD-10-CM | POA: Diagnosis not present

## 2016-02-15 DIAGNOSIS — Z7901 Long term (current) use of anticoagulants: Secondary | ICD-10-CM | POA: Diagnosis not present

## 2016-02-15 DIAGNOSIS — Z1211 Encounter for screening for malignant neoplasm of colon: Secondary | ICD-10-CM | POA: Diagnosis not present

## 2016-02-15 DIAGNOSIS — Z6836 Body mass index (BMI) 36.0-36.9, adult: Secondary | ICD-10-CM | POA: Diagnosis not present

## 2016-02-15 DIAGNOSIS — Z86010 Personal history of colonic polyps: Secondary | ICD-10-CM | POA: Diagnosis not present

## 2016-02-15 DIAGNOSIS — E119 Type 2 diabetes mellitus without complications: Secondary | ICD-10-CM | POA: Diagnosis not present

## 2016-02-15 DIAGNOSIS — I10 Essential (primary) hypertension: Secondary | ICD-10-CM | POA: Diagnosis not present

## 2016-02-15 DIAGNOSIS — Z91048 Other nonmedicinal substance allergy status: Secondary | ICD-10-CM | POA: Diagnosis not present

## 2016-02-15 DIAGNOSIS — Z881 Allergy status to other antibiotic agents status: Secondary | ICD-10-CM | POA: Diagnosis not present

## 2016-02-15 DIAGNOSIS — Z888 Allergy status to other drugs, medicaments and biological substances status: Secondary | ICD-10-CM | POA: Diagnosis not present

## 2016-02-16 DIAGNOSIS — S52501D Unspecified fracture of the lower end of right radius, subsequent encounter for closed fracture with routine healing: Secondary | ICD-10-CM | POA: Diagnosis not present

## 2016-02-16 DIAGNOSIS — M25531 Pain in right wrist: Secondary | ICD-10-CM | POA: Diagnosis not present

## 2016-02-20 DIAGNOSIS — Z79899 Other long term (current) drug therapy: Secondary | ICD-10-CM | POA: Diagnosis not present

## 2016-02-20 DIAGNOSIS — E785 Hyperlipidemia, unspecified: Secondary | ICD-10-CM | POA: Diagnosis not present

## 2016-02-20 DIAGNOSIS — R5383 Other fatigue: Secondary | ICD-10-CM | POA: Diagnosis not present

## 2016-02-20 DIAGNOSIS — Z6837 Body mass index (BMI) 37.0-37.9, adult: Secondary | ICD-10-CM | POA: Diagnosis not present

## 2016-02-20 DIAGNOSIS — Z299 Encounter for prophylactic measures, unspecified: Secondary | ICD-10-CM | POA: Diagnosis not present

## 2016-02-20 DIAGNOSIS — Z1211 Encounter for screening for malignant neoplasm of colon: Secondary | ICD-10-CM | POA: Diagnosis not present

## 2016-02-20 DIAGNOSIS — Z7189 Other specified counseling: Secondary | ICD-10-CM | POA: Diagnosis not present

## 2016-02-20 DIAGNOSIS — Z1389 Encounter for screening for other disorder: Secondary | ICD-10-CM | POA: Diagnosis not present

## 2016-02-20 DIAGNOSIS — Z0000 Encounter for general adult medical examination without abnormal findings: Secondary | ICD-10-CM | POA: Diagnosis not present

## 2016-03-04 DIAGNOSIS — E1165 Type 2 diabetes mellitus with hyperglycemia: Secondary | ICD-10-CM | POA: Diagnosis not present

## 2016-03-04 DIAGNOSIS — I10 Essential (primary) hypertension: Secondary | ICD-10-CM | POA: Diagnosis not present

## 2016-03-04 DIAGNOSIS — Z87891 Personal history of nicotine dependence: Secondary | ICD-10-CM | POA: Diagnosis not present

## 2016-03-06 DIAGNOSIS — S52501D Unspecified fracture of the lower end of right radius, subsequent encounter for closed fracture with routine healing: Secondary | ICD-10-CM | POA: Diagnosis not present

## 2016-03-06 DIAGNOSIS — M25531 Pain in right wrist: Secondary | ICD-10-CM | POA: Diagnosis not present

## 2016-03-14 DIAGNOSIS — M25531 Pain in right wrist: Secondary | ICD-10-CM | POA: Diagnosis not present

## 2016-03-14 DIAGNOSIS — S52501D Unspecified fracture of the lower end of right radius, subsequent encounter for closed fracture with routine healing: Secondary | ICD-10-CM | POA: Diagnosis not present

## 2016-03-15 DIAGNOSIS — S52501D Unspecified fracture of the lower end of right radius, subsequent encounter for closed fracture with routine healing: Secondary | ICD-10-CM | POA: Diagnosis not present

## 2016-04-18 DIAGNOSIS — L609 Nail disorder, unspecified: Secondary | ICD-10-CM | POA: Diagnosis not present

## 2016-04-18 DIAGNOSIS — E1140 Type 2 diabetes mellitus with diabetic neuropathy, unspecified: Secondary | ICD-10-CM | POA: Diagnosis not present

## 2016-04-18 DIAGNOSIS — E1151 Type 2 diabetes mellitus with diabetic peripheral angiopathy without gangrene: Secondary | ICD-10-CM | POA: Diagnosis not present

## 2016-04-18 DIAGNOSIS — L110 Acquired keratosis follicularis: Secondary | ICD-10-CM | POA: Diagnosis not present

## 2016-05-17 ENCOUNTER — Other Ambulatory Visit: Payer: Self-pay | Admitting: Cardiology

## 2016-05-18 DIAGNOSIS — L03116 Cellulitis of left lower limb: Secondary | ICD-10-CM | POA: Diagnosis not present

## 2016-05-20 DIAGNOSIS — L03119 Cellulitis of unspecified part of limb: Secondary | ICD-10-CM | POA: Diagnosis not present

## 2016-05-20 DIAGNOSIS — L0390 Cellulitis, unspecified: Secondary | ICD-10-CM | POA: Diagnosis not present

## 2016-05-20 DIAGNOSIS — I4891 Unspecified atrial fibrillation: Secondary | ICD-10-CM | POA: Diagnosis not present

## 2016-05-20 DIAGNOSIS — Z299 Encounter for prophylactic measures, unspecified: Secondary | ICD-10-CM | POA: Diagnosis not present

## 2016-05-20 DIAGNOSIS — E1165 Type 2 diabetes mellitus with hyperglycemia: Secondary | ICD-10-CM | POA: Diagnosis not present

## 2016-05-24 ENCOUNTER — Other Ambulatory Visit: Payer: Self-pay | Admitting: Cardiology

## 2016-06-05 DIAGNOSIS — Z23 Encounter for immunization: Secondary | ICD-10-CM | POA: Diagnosis not present

## 2016-06-12 ENCOUNTER — Other Ambulatory Visit: Payer: Self-pay | Admitting: *Deleted

## 2016-06-12 DIAGNOSIS — E1165 Type 2 diabetes mellitus with hyperglycemia: Secondary | ICD-10-CM | POA: Diagnosis not present

## 2016-06-12 DIAGNOSIS — Z6836 Body mass index (BMI) 36.0-36.9, adult: Secondary | ICD-10-CM | POA: Diagnosis not present

## 2016-06-12 DIAGNOSIS — Z713 Dietary counseling and surveillance: Secondary | ICD-10-CM | POA: Diagnosis not present

## 2016-06-12 MED ORDER — RIVAROXABAN 15 MG PO TABS: 15.0000 mg | ORAL_TABLET | Freq: Every day | ORAL | 0 refills | Status: DC

## 2016-07-08 DIAGNOSIS — M5136 Other intervertebral disc degeneration, lumbar region: Secondary | ICD-10-CM | POA: Diagnosis not present

## 2016-07-08 DIAGNOSIS — G8929 Other chronic pain: Secondary | ICD-10-CM | POA: Diagnosis not present

## 2016-07-08 DIAGNOSIS — M5441 Lumbago with sciatica, right side: Secondary | ICD-10-CM | POA: Diagnosis not present

## 2016-07-08 DIAGNOSIS — M5442 Lumbago with sciatica, left side: Secondary | ICD-10-CM | POA: Diagnosis not present

## 2016-07-09 DIAGNOSIS — H6123 Impacted cerumen, bilateral: Secondary | ICD-10-CM | POA: Diagnosis not present

## 2016-07-17 DIAGNOSIS — M5442 Lumbago with sciatica, left side: Secondary | ICD-10-CM | POA: Diagnosis not present

## 2016-07-17 DIAGNOSIS — M545 Low back pain: Secondary | ICD-10-CM | POA: Diagnosis not present

## 2016-07-22 DIAGNOSIS — M5442 Lumbago with sciatica, left side: Secondary | ICD-10-CM | POA: Diagnosis not present

## 2016-07-22 DIAGNOSIS — M545 Low back pain: Secondary | ICD-10-CM | POA: Diagnosis not present

## 2016-07-24 DIAGNOSIS — M545 Low back pain: Secondary | ICD-10-CM | POA: Diagnosis not present

## 2016-07-24 DIAGNOSIS — M5442 Lumbago with sciatica, left side: Secondary | ICD-10-CM | POA: Diagnosis not present

## 2016-07-29 DIAGNOSIS — M5442 Lumbago with sciatica, left side: Secondary | ICD-10-CM | POA: Diagnosis not present

## 2016-07-29 DIAGNOSIS — M545 Low back pain: Secondary | ICD-10-CM | POA: Diagnosis not present

## 2016-07-31 DIAGNOSIS — M545 Low back pain: Secondary | ICD-10-CM | POA: Diagnosis not present

## 2016-07-31 DIAGNOSIS — M5442 Lumbago with sciatica, left side: Secondary | ICD-10-CM | POA: Diagnosis not present

## 2016-08-02 ENCOUNTER — Other Ambulatory Visit: Payer: Self-pay | Admitting: *Deleted

## 2016-08-02 DIAGNOSIS — M5442 Lumbago with sciatica, left side: Secondary | ICD-10-CM | POA: Diagnosis not present

## 2016-08-02 DIAGNOSIS — M545 Low back pain: Secondary | ICD-10-CM | POA: Diagnosis not present

## 2016-08-02 MED ORDER — RIVAROXABAN 15 MG PO TABS: 15.0000 mg | ORAL_TABLET | Freq: Every day | ORAL | 6 refills | Status: DC

## 2016-08-05 DIAGNOSIS — M545 Low back pain: Secondary | ICD-10-CM | POA: Diagnosis not present

## 2016-08-05 DIAGNOSIS — M5442 Lumbago with sciatica, left side: Secondary | ICD-10-CM | POA: Diagnosis not present

## 2016-08-07 DIAGNOSIS — M5442 Lumbago with sciatica, left side: Secondary | ICD-10-CM | POA: Diagnosis not present

## 2016-08-07 DIAGNOSIS — M545 Low back pain: Secondary | ICD-10-CM | POA: Diagnosis not present

## 2016-08-09 DIAGNOSIS — M545 Low back pain: Secondary | ICD-10-CM | POA: Diagnosis not present

## 2016-08-09 DIAGNOSIS — M5136 Other intervertebral disc degeneration, lumbar region: Secondary | ICD-10-CM | POA: Diagnosis not present

## 2016-08-09 DIAGNOSIS — M5441 Lumbago with sciatica, right side: Secondary | ICD-10-CM | POA: Diagnosis not present

## 2016-08-09 DIAGNOSIS — M5442 Lumbago with sciatica, left side: Secondary | ICD-10-CM | POA: Diagnosis not present

## 2016-08-09 DIAGNOSIS — G8929 Other chronic pain: Secondary | ICD-10-CM | POA: Diagnosis not present

## 2016-08-19 DIAGNOSIS — M545 Low back pain: Secondary | ICD-10-CM | POA: Diagnosis not present

## 2016-08-19 DIAGNOSIS — M5442 Lumbago with sciatica, left side: Secondary | ICD-10-CM | POA: Diagnosis not present

## 2016-08-21 DIAGNOSIS — M5442 Lumbago with sciatica, left side: Secondary | ICD-10-CM | POA: Diagnosis not present

## 2016-08-21 DIAGNOSIS — M545 Low back pain: Secondary | ICD-10-CM | POA: Diagnosis not present

## 2016-08-22 DIAGNOSIS — L110 Acquired keratosis follicularis: Secondary | ICD-10-CM | POA: Diagnosis not present

## 2016-08-22 DIAGNOSIS — E1140 Type 2 diabetes mellitus with diabetic neuropathy, unspecified: Secondary | ICD-10-CM | POA: Diagnosis not present

## 2016-08-22 DIAGNOSIS — L609 Nail disorder, unspecified: Secondary | ICD-10-CM | POA: Diagnosis not present

## 2016-08-22 DIAGNOSIS — E1151 Type 2 diabetes mellitus with diabetic peripheral angiopathy without gangrene: Secondary | ICD-10-CM | POA: Diagnosis not present

## 2016-08-26 DIAGNOSIS — M545 Low back pain: Secondary | ICD-10-CM | POA: Diagnosis not present

## 2016-08-26 DIAGNOSIS — M5442 Lumbago with sciatica, left side: Secondary | ICD-10-CM | POA: Diagnosis not present

## 2016-08-28 DIAGNOSIS — M545 Low back pain: Secondary | ICD-10-CM | POA: Diagnosis not present

## 2016-08-28 DIAGNOSIS — M5442 Lumbago with sciatica, left side: Secondary | ICD-10-CM | POA: Diagnosis not present

## 2016-08-30 DIAGNOSIS — M545 Low back pain: Secondary | ICD-10-CM | POA: Diagnosis not present

## 2016-08-30 DIAGNOSIS — M5442 Lumbago with sciatica, left side: Secondary | ICD-10-CM | POA: Diagnosis not present

## 2016-09-02 NOTE — Progress Notes (Signed)
Cardiology Office Note  Date: 09/03/2016   ID: [name] ROZAK, DOB 1936/09/02, MRN [id number]  PCP: Stephanie Chroman, [name]  Primary Cardiologist: [name]   Chief Complaint  Patient presents with  . Atrial Fibrillation    History of Present Illness: [name] is a 80 y.o. female last seen in May. She presents for a routine follow-up visit. Continue to do well without any significant sense of palpitations or chest pain. She reports compliance with her medications.  I reviewed her ECG today which shows sinus bradycardia with prolonged PR interval, QRS duration 114 ms. There is decreased R wave progression and nonspecific T-wave changes.  Current cardiac regimen includes flecainide, Lopressor, Xarelto, and Norvasc. She had lab work back in May as outlined below. Due for follow-up. She does not report any spontaneous bleeding problems.  Blood pressure is well controlled today.  Past Medical History:  Diagnosis Date  . Atrial fibrillation ([place])   . Atrial flutter ([place])   . Essential hypertension, benign   . GERD (gastroesophageal reflux disease)   . OSA on CPAP     Past Surgical History:  Procedure Laterality Date  . ABDOMINAL HYSTERECTOMY     Precancerous lesion  . CHOLECYSTECTOMY      Current Outpatient Prescriptions  Medication Sig Dispense Refill  . amLODipine (NORVASC) 10 MG tablet Take 10 mg by mouth daily.    Marland Kitchen doxazosin (CARDURA) 8 MG tablet Take 4 mg by mouth at bedtime.    . flecainide (TAMBOCOR) 100 MG tablet TAKE 1 TABLET BY MOUTH TWICE A DAY 60 tablet 3  . gemfibrozil (LOPID) 600 MG tablet Take 600 mg by mouth 2 (two) times daily before a meal.      . glimepiride (AMARYL) 4 MG tablet Take 4 mg by mouth 2 (two) times daily.    . hydrochlorothiazide 25 MG tablet Take 25 mg by mouth daily.      . insulin aspart (NOVOLOG) 100 UNIT/ML FlexPen Inject 10 Units into the skin 2 (two) times daily.    . Insulin Glargine (TOUJEO SOLOSTAR [place]) Inject 44 Units  into the skin at bedtime.     . metformin (FORTAMET) 500 MG (OSM) 24 hr tablet Take 500 mg by mouth 2 (two) times daily with a meal.     . metoprolol (LOPRESSOR) 50 MG tablet Take 50 mg by mouth 2 (two) times daily.      Marland Kitchen omeprazole (PRILOSEC) 20 MG capsule Take 20 mg by mouth daily.      . Rivaroxaban (XARELTO) 15 MG TABS tablet Take 1 tablet (15 mg total) by mouth daily. 28 tablet 0  . venlafaxine XR (EFFEXOR-XR) 75 MG 24 hr capsule Take 75 mg by mouth daily.      No current facility-administered medications for this visit.    Allergies:  Amoxicillin and Codeine phosphate   Social History: The patient  reports that she quit smoking about 37 years ago. Her smoking use included Cigarettes. She started smoking about 60 years ago. She has never used smokeless tobacco. She reports that she does not drink alcohol or use drugs.   ROS:  Please see the history of present illness. Otherwise, complete review of systems is positive for chronic back pain, goes to physical therapy regularly in [place].  All other systems are reviewed and negative.   Physical Exam: VS:  BP 108/62   Pulse 61   Ht 5\' 5"  (1.651 m)   Wt 235 lb (106.6 kg)  SpO2 95%   BMI 39.11 kg/m , BMI Body mass index is 39.11 kg/m.  Wt Readings from Last 3 Encounters:  09/03/16 235 lb (106.6 kg)  02/06/16 230 lb (104.3 kg)  08/14/15 225 lb (102.1 kg)    Appears comfortable at rest. HEENT: Conjunctiva and lids normal, oropharynx clear.  Neck: Supple, no elevated JVP or carotid bruits, no thyromegaly.  Lungs: Clear to auscultation, nonlabored breathing at rest.  Cardiac: Regular rate and rhythm, no S3 or significant systolic murmur, no pericardial rub.  Extremities: No pitting edema, distal pulses 2+. Wearing brace on right wrist Skin: Warm and dry. Musculoskeletal: No kyphosis. Neuropsychiatric: Alert and oriented 3, affect appropriate.  ECG: I personally reviewed the tracing from 02/06/2016 which showed sinus  bradycardia with prolonged PR interval, QRS duration 106 ms, borderline low voltage, leftward axis, and decreased R wave progression.  Recent Labwork:  May 2017: BUN 16, creatinine 1.1, potassium 4.1, hemoglobin 13.1, platelets 277  Assessment and Plan:  1. Paroxysmal atrial fibrillation/flutter. Symptomatically well controlled on current regimen including flecainide and Lopressor. CHADSVASC score is 4 and she is on Xarelto. ECG reviewed. We will obtain follow-up CBC and BMET.  2. Essential hypertension, blood pressure control is adequate today.  3. OSA on CPAP. She continues to follow with Dr. Woody Seller.  4. GERD, remains on Prilosec. Stable symptom control.  Current medicines were reviewed with the patient today.   Orders Placed This Encounter  Procedures  . Basic metabolic panel  . CBC  . EKG 12-Lead    Disposition: Follow-up in 6 months.  Signed, Satira Sark, [name], [facility] 09/03/2016 11:49 AM    [facility] [place], [place], [place] [address] Phone: [phone]; Fax: [phone]

## 2016-09-03 ENCOUNTER — Encounter: Payer: Self-pay | Admitting: Cardiology

## 2016-09-03 ENCOUNTER — Ambulatory Visit (INDEPENDENT_AMBULATORY_CARE_PROVIDER_SITE_OTHER): Payer: Medicare Other | Admitting: Cardiology

## 2016-09-03 VITALS — BP 108/62 | HR 61 | Ht 65.0 in | Wt 235.0 lb

## 2016-09-03 DIAGNOSIS — G4733 Obstructive sleep apnea (adult) (pediatric): Secondary | ICD-10-CM | POA: Diagnosis not present

## 2016-09-03 DIAGNOSIS — I10 Essential (primary) hypertension: Secondary | ICD-10-CM

## 2016-09-03 DIAGNOSIS — I480 Paroxysmal atrial fibrillation: Secondary | ICD-10-CM | POA: Diagnosis not present

## 2016-09-03 DIAGNOSIS — Z9989 Dependence on other enabling machines and devices: Secondary | ICD-10-CM

## 2016-09-03 DIAGNOSIS — K219 Gastro-esophageal reflux disease without esophagitis: Secondary | ICD-10-CM

## 2016-09-03 MED ORDER — RIVAROXABAN 15 MG PO TABS: 15.0000 mg | ORAL_TABLET | Freq: Every day | ORAL | 0 refills | Status: DC

## 2016-09-03 NOTE — Patient Instructions (Signed)
Medication Instructions:  Continue all current medications.  Labwork:  BMET, CBC - orders given today.  Office will contact with results via phone or letter.    Testing/Procedures: none  Follow-Up: Your physician wants you to follow up in: 6 months.  You will receive a reminder letter in the mail one-two months in advance.  If you don't receive a letter, please call our office to schedule the follow up appointment   Any Other Special Instructions Will Be Listed Below (If Applicable).  If you need a refill on your cardiac medications before your next appointment, please call your pharmacy.

## 2016-09-04 DIAGNOSIS — G8929 Other chronic pain: Secondary | ICD-10-CM | POA: Diagnosis not present

## 2016-09-04 DIAGNOSIS — M545 Low back pain: Secondary | ICD-10-CM | POA: Diagnosis not present

## 2016-09-04 DIAGNOSIS — M5441 Lumbago with sciatica, right side: Secondary | ICD-10-CM | POA: Diagnosis not present

## 2016-09-04 DIAGNOSIS — M5442 Lumbago with sciatica, left side: Secondary | ICD-10-CM | POA: Diagnosis not present

## 2016-09-04 DIAGNOSIS — M5136 Other intervertebral disc degeneration, lumbar region: Secondary | ICD-10-CM | POA: Diagnosis not present

## 2016-09-05 ENCOUNTER — Telehealth: Payer: Self-pay | Admitting: *Deleted

## 2016-09-05 NOTE — Telephone Encounter (Signed)
-----   Message from Satira Sark, MD sent at 09/03/2016  2:45 PM EST ----- Results reviewed. Renal function looks good with creatinine 1.1, potassium normal, hemoglobin also normal. Xarelto dose can be 20 mg daily. A copy of this test should be forwarded to Glenda Chroman, MD.

## 2016-09-09 DIAGNOSIS — M545 Low back pain: Secondary | ICD-10-CM | POA: Diagnosis not present

## 2016-09-09 DIAGNOSIS — M5442 Lumbago with sciatica, left side: Secondary | ICD-10-CM | POA: Diagnosis not present

## 2016-09-09 MED ORDER — RIVAROXABAN 20 MG PO TABS: 20.0000 mg | ORAL_TABLET | Freq: Every day | ORAL | 3 refills | Status: DC

## 2016-09-09 NOTE — Telephone Encounter (Signed)
Patient informed and verbalized understanding of plan. Copy sent to PCP 

## 2016-09-11 ENCOUNTER — Other Ambulatory Visit: Payer: Self-pay | Admitting: Cardiology

## 2016-09-11 DIAGNOSIS — M545 Low back pain: Secondary | ICD-10-CM | POA: Diagnosis not present

## 2016-09-11 DIAGNOSIS — M5442 Lumbago with sciatica, left side: Secondary | ICD-10-CM | POA: Diagnosis not present

## 2016-09-17 DIAGNOSIS — E1165 Type 2 diabetes mellitus with hyperglycemia: Secondary | ICD-10-CM | POA: Diagnosis not present

## 2016-09-17 DIAGNOSIS — Z713 Dietary counseling and surveillance: Secondary | ICD-10-CM | POA: Diagnosis not present

## 2016-09-17 DIAGNOSIS — I4891 Unspecified atrial fibrillation: Secondary | ICD-10-CM | POA: Diagnosis not present

## 2016-09-17 DIAGNOSIS — Z6838 Body mass index (BMI) 38.0-38.9, adult: Secondary | ICD-10-CM | POA: Diagnosis not present

## 2016-09-20 DIAGNOSIS — M545 Low back pain: Secondary | ICD-10-CM | POA: Diagnosis not present

## 2016-09-20 DIAGNOSIS — M5442 Lumbago with sciatica, left side: Secondary | ICD-10-CM | POA: Diagnosis not present

## 2016-09-24 DIAGNOSIS — M5442 Lumbago with sciatica, left side: Secondary | ICD-10-CM | POA: Diagnosis not present

## 2016-09-24 DIAGNOSIS — M545 Low back pain: Secondary | ICD-10-CM | POA: Diagnosis not present

## 2016-09-26 DIAGNOSIS — M5442 Lumbago with sciatica, left side: Secondary | ICD-10-CM | POA: Diagnosis not present

## 2016-09-26 DIAGNOSIS — M545 Low back pain: Secondary | ICD-10-CM | POA: Diagnosis not present

## 2016-10-03 DIAGNOSIS — M5441 Lumbago with sciatica, right side: Secondary | ICD-10-CM | POA: Diagnosis not present

## 2016-10-03 DIAGNOSIS — M5136 Other intervertebral disc degeneration, lumbar region: Secondary | ICD-10-CM | POA: Diagnosis not present

## 2016-10-03 DIAGNOSIS — M5442 Lumbago with sciatica, left side: Secondary | ICD-10-CM | POA: Diagnosis not present

## 2016-10-03 DIAGNOSIS — G8929 Other chronic pain: Secondary | ICD-10-CM | POA: Diagnosis not present

## 2016-10-03 DIAGNOSIS — M545 Low back pain: Secondary | ICD-10-CM | POA: Diagnosis not present

## 2016-10-10 DIAGNOSIS — R05 Cough: Secondary | ICD-10-CM | POA: Diagnosis not present

## 2016-10-10 DIAGNOSIS — J069 Acute upper respiratory infection, unspecified: Secondary | ICD-10-CM | POA: Diagnosis not present

## 2016-10-21 DIAGNOSIS — M545 Low back pain: Secondary | ICD-10-CM | POA: Diagnosis not present

## 2016-10-21 DIAGNOSIS — M5442 Lumbago with sciatica, left side: Secondary | ICD-10-CM | POA: Diagnosis not present

## 2016-10-22 DIAGNOSIS — L821 Other seborrheic keratosis: Secondary | ICD-10-CM | POA: Diagnosis not present

## 2016-10-22 DIAGNOSIS — L579 Skin changes due to chronic exposure to nonionizing radiation, unspecified: Secondary | ICD-10-CM | POA: Diagnosis not present

## 2016-10-22 DIAGNOSIS — D229 Melanocytic nevi, unspecified: Secondary | ICD-10-CM | POA: Diagnosis not present

## 2016-10-22 DIAGNOSIS — D1801 Hemangioma of skin and subcutaneous tissue: Secondary | ICD-10-CM | POA: Diagnosis not present

## 2016-10-25 DIAGNOSIS — R05 Cough: Secondary | ICD-10-CM | POA: Diagnosis not present

## 2016-10-30 DIAGNOSIS — M5442 Lumbago with sciatica, left side: Secondary | ICD-10-CM | POA: Diagnosis not present

## 2016-10-30 DIAGNOSIS — M5441 Lumbago with sciatica, right side: Secondary | ICD-10-CM | POA: Diagnosis not present

## 2016-10-30 DIAGNOSIS — M5136 Other intervertebral disc degeneration, lumbar region: Secondary | ICD-10-CM | POA: Diagnosis not present

## 2016-10-30 DIAGNOSIS — G8929 Other chronic pain: Secondary | ICD-10-CM | POA: Diagnosis not present

## 2016-11-04 DIAGNOSIS — E119 Type 2 diabetes mellitus without complications: Secondary | ICD-10-CM | POA: Diagnosis not present

## 2016-11-04 DIAGNOSIS — I4891 Unspecified atrial fibrillation: Secondary | ICD-10-CM | POA: Diagnosis not present

## 2016-11-04 DIAGNOSIS — E7800 Pure hypercholesterolemia, unspecified: Secondary | ICD-10-CM | POA: Diagnosis not present

## 2016-11-04 DIAGNOSIS — I10 Essential (primary) hypertension: Secondary | ICD-10-CM | POA: Diagnosis not present

## 2016-11-07 DIAGNOSIS — L609 Nail disorder, unspecified: Secondary | ICD-10-CM | POA: Diagnosis not present

## 2016-11-07 DIAGNOSIS — E1151 Type 2 diabetes mellitus with diabetic peripheral angiopathy without gangrene: Secondary | ICD-10-CM | POA: Diagnosis not present

## 2016-11-07 DIAGNOSIS — L110 Acquired keratosis follicularis: Secondary | ICD-10-CM | POA: Diagnosis not present

## 2016-11-07 DIAGNOSIS — E1140 Type 2 diabetes mellitus with diabetic neuropathy, unspecified: Secondary | ICD-10-CM | POA: Diagnosis not present

## 2016-11-26 DIAGNOSIS — M5136 Other intervertebral disc degeneration, lumbar region: Secondary | ICD-10-CM | POA: Diagnosis not present

## 2016-11-26 DIAGNOSIS — M5441 Lumbago with sciatica, right side: Secondary | ICD-10-CM | POA: Diagnosis not present

## 2016-11-26 DIAGNOSIS — M5442 Lumbago with sciatica, left side: Secondary | ICD-10-CM | POA: Diagnosis not present

## 2016-11-26 DIAGNOSIS — G8929 Other chronic pain: Secondary | ICD-10-CM | POA: Diagnosis not present

## 2016-12-02 DIAGNOSIS — E7800 Pure hypercholesterolemia, unspecified: Secondary | ICD-10-CM | POA: Diagnosis not present

## 2016-12-02 DIAGNOSIS — E119 Type 2 diabetes mellitus without complications: Secondary | ICD-10-CM | POA: Diagnosis not present

## 2016-12-02 DIAGNOSIS — I4891 Unspecified atrial fibrillation: Secondary | ICD-10-CM | POA: Diagnosis not present

## 2016-12-02 DIAGNOSIS — I10 Essential (primary) hypertension: Secondary | ICD-10-CM | POA: Diagnosis not present

## 2017-01-01 DIAGNOSIS — Z6839 Body mass index (BMI) 39.0-39.9, adult: Secondary | ICD-10-CM | POA: Diagnosis not present

## 2017-01-01 DIAGNOSIS — F329 Major depressive disorder, single episode, unspecified: Secondary | ICD-10-CM | POA: Diagnosis not present

## 2017-01-01 DIAGNOSIS — E7800 Pure hypercholesterolemia, unspecified: Secondary | ICD-10-CM | POA: Diagnosis not present

## 2017-01-01 DIAGNOSIS — Z299 Encounter for prophylactic measures, unspecified: Secondary | ICD-10-CM | POA: Diagnosis not present

## 2017-01-01 DIAGNOSIS — I4891 Unspecified atrial fibrillation: Secondary | ICD-10-CM | POA: Diagnosis not present

## 2017-01-01 DIAGNOSIS — E1165 Type 2 diabetes mellitus with hyperglycemia: Secondary | ICD-10-CM | POA: Diagnosis not present

## 2017-01-01 DIAGNOSIS — K219 Gastro-esophageal reflux disease without esophagitis: Secondary | ICD-10-CM | POA: Diagnosis not present

## 2017-01-01 DIAGNOSIS — I10 Essential (primary) hypertension: Secondary | ICD-10-CM | POA: Diagnosis not present

## 2017-01-05 ENCOUNTER — Other Ambulatory Visit: Payer: Self-pay | Admitting: Cardiology

## 2017-01-15 DIAGNOSIS — M25562 Pain in left knee: Secondary | ICD-10-CM | POA: Diagnosis not present

## 2017-01-15 DIAGNOSIS — Z6838 Body mass index (BMI) 38.0-38.9, adult: Secondary | ICD-10-CM | POA: Diagnosis not present

## 2017-01-15 DIAGNOSIS — Z299 Encounter for prophylactic measures, unspecified: Secondary | ICD-10-CM | POA: Diagnosis not present

## 2017-01-15 DIAGNOSIS — E7800 Pure hypercholesterolemia, unspecified: Secondary | ICD-10-CM | POA: Diagnosis not present

## 2017-01-15 DIAGNOSIS — E1122 Type 2 diabetes mellitus with diabetic chronic kidney disease: Secondary | ICD-10-CM | POA: Diagnosis not present

## 2017-01-15 DIAGNOSIS — N183 Chronic kidney disease, stage 3 (moderate): Secondary | ICD-10-CM | POA: Diagnosis not present

## 2017-01-15 DIAGNOSIS — I4891 Unspecified atrial fibrillation: Secondary | ICD-10-CM | POA: Diagnosis not present

## 2017-01-15 DIAGNOSIS — W19XXXA Unspecified fall, initial encounter: Secondary | ICD-10-CM | POA: Diagnosis not present

## 2017-01-15 DIAGNOSIS — M25561 Pain in right knee: Secondary | ICD-10-CM | POA: Diagnosis not present

## 2017-01-15 DIAGNOSIS — R600 Localized edema: Secondary | ICD-10-CM | POA: Diagnosis not present

## 2017-01-15 DIAGNOSIS — R51 Headache: Secondary | ICD-10-CM | POA: Diagnosis not present

## 2017-01-15 DIAGNOSIS — S0993XA Unspecified injury of face, initial encounter: Secondary | ICD-10-CM | POA: Diagnosis not present

## 2017-01-16 DIAGNOSIS — E1151 Type 2 diabetes mellitus with diabetic peripheral angiopathy without gangrene: Secondary | ICD-10-CM | POA: Diagnosis not present

## 2017-01-16 DIAGNOSIS — L110 Acquired keratosis follicularis: Secondary | ICD-10-CM | POA: Diagnosis not present

## 2017-01-16 DIAGNOSIS — L609 Nail disorder, unspecified: Secondary | ICD-10-CM | POA: Diagnosis not present

## 2017-01-16 DIAGNOSIS — E1140 Type 2 diabetes mellitus with diabetic neuropathy, unspecified: Secondary | ICD-10-CM | POA: Diagnosis not present

## 2017-01-22 DIAGNOSIS — Z1231 Encounter for screening mammogram for malignant neoplasm of breast: Secondary | ICD-10-CM | POA: Diagnosis not present

## 2017-01-31 DIAGNOSIS — H1013 Acute atopic conjunctivitis, bilateral: Secondary | ICD-10-CM | POA: Diagnosis not present

## 2017-01-31 DIAGNOSIS — E119 Type 2 diabetes mellitus without complications: Secondary | ICD-10-CM | POA: Diagnosis not present

## 2017-01-31 DIAGNOSIS — Z794 Long term (current) use of insulin: Secondary | ICD-10-CM | POA: Diagnosis not present

## 2017-02-25 DIAGNOSIS — N183 Chronic kidney disease, stage 3 (moderate): Secondary | ICD-10-CM | POA: Diagnosis not present

## 2017-02-25 DIAGNOSIS — E7800 Pure hypercholesterolemia, unspecified: Secondary | ICD-10-CM | POA: Diagnosis not present

## 2017-02-25 DIAGNOSIS — Z6841 Body Mass Index (BMI) 40.0 and over, adult: Secondary | ICD-10-CM | POA: Diagnosis not present

## 2017-02-25 DIAGNOSIS — Z1211 Encounter for screening for malignant neoplasm of colon: Secondary | ICD-10-CM | POA: Diagnosis not present

## 2017-02-25 DIAGNOSIS — R5383 Other fatigue: Secondary | ICD-10-CM | POA: Diagnosis not present

## 2017-02-25 DIAGNOSIS — E1122 Type 2 diabetes mellitus with diabetic chronic kidney disease: Secondary | ICD-10-CM | POA: Diagnosis not present

## 2017-02-25 DIAGNOSIS — F329 Major depressive disorder, single episode, unspecified: Secondary | ICD-10-CM | POA: Diagnosis not present

## 2017-02-25 DIAGNOSIS — Z7189 Other specified counseling: Secondary | ICD-10-CM | POA: Diagnosis not present

## 2017-02-25 DIAGNOSIS — E559 Vitamin D deficiency, unspecified: Secondary | ICD-10-CM | POA: Diagnosis not present

## 2017-02-25 DIAGNOSIS — Z0000 Encounter for general adult medical examination without abnormal findings: Secondary | ICD-10-CM | POA: Diagnosis not present

## 2017-02-25 DIAGNOSIS — Z299 Encounter for prophylactic measures, unspecified: Secondary | ICD-10-CM | POA: Diagnosis not present

## 2017-02-25 DIAGNOSIS — Z1389 Encounter for screening for other disorder: Secondary | ICD-10-CM | POA: Diagnosis not present

## 2017-02-28 ENCOUNTER — Encounter: Payer: Self-pay | Admitting: *Deleted

## 2017-03-02 NOTE — Progress Notes (Signed)
Cardiology Office Note  Date: 03/03/2017   ID: [name], DOB 1935/12/31, MRN [id number]  PCP: Glenda Chroman, MD  Primary Cardiologist: Rozann Lesches, MD   Chief Complaint  Patient presents with  . PAF    History of Present Illness: CAROLLYNN [name] is a 81 y.o. female last seen in December 2017. She presents for a routine visit. States that a few weeks ago she had an episode of shortness of breath in the evening, spontaneously resolved. Felt somewhat like she did when she had atrial fibrillation although did not specifically have palpitations or chest pain. She reports compliance with her medications and has had no subsequent events. Has been somewhat fatigued.  Current cardiac regimen includes flecainide, Norvasc, Lopressor, and Xarelto. She is not reporting any bleeding problems.  She continues to follow with Dr. Woody Seller. She has follow-up lab work pending.  I personally reviewed her ECG today which shows sinus rhythm with prolonged PR interval, PVCs, decreased R wave progression. Stable QRS duration and QTc.  Past Medical History:  Diagnosis Date  . Atrial fibrillation ([place])   . Atrial flutter ([place])   . Essential hypertension, benign   . GERD (gastroesophageal reflux disease)   . OSA on CPAP     Past Surgical History:  Procedure Laterality Date  . ABDOMINAL HYSTERECTOMY     Precancerous lesion  . CHOLECYSTECTOMY      Current Outpatient Prescriptions  Medication Sig Dispense Refill  . amLODipine (NORVASC) 10 MG tablet Take 10 mg by mouth daily.    Marland Kitchen doxazosin (CARDURA) 8 MG tablet Take 4 mg by mouth at bedtime.    . flecainide (TAMBOCOR) 100 MG tablet TAKE 1 TABLET BY MOUTH TWICE A DAY 60 tablet 3  . gemfibrozil (LOPID) 600 MG tablet Take 600 mg by mouth 2 (two) times daily before a meal.      . glimepiride (AMARYL) 4 MG tablet Take 4 mg by mouth 2 (two) times daily.    . hydrochlorothiazide 25 MG tablet Take 25 mg by mouth daily.      . insulin aspart  (NOVOLOG) 100 UNIT/ML FlexPen Inject 10 Units into the skin 2 (two) times daily.    . Insulin Glargine (TOUJEO SOLOSTAR [place]) Inject 44 Units into the skin at bedtime.     . metformin (FORTAMET) 500 MG (OSM) 24 hr tablet Take 500 mg by mouth 2 (two) times daily with a meal.     . metoprolol (LOPRESSOR) 50 MG tablet Take 50 mg by mouth 2 (two) times daily.      Marland Kitchen omeprazole (PRILOSEC) 20 MG capsule Take 20 mg by mouth daily.      . rivaroxaban (XARELTO) 20 MG TABS tablet Take 1 tablet (20 mg total) by mouth daily with supper. 30 tablet 3  . venlafaxine XR (EFFEXOR-XR) 75 MG 24 hr capsule Take 75 mg by mouth daily.      No current facility-administered medications for this visit.    Allergies:  Amoxicillin and Codeine phosphate   Social History: The patient  reports that she quit smoking about 38 years ago. Her smoking use included Cigarettes. She started smoking about 60 years ago. She has never used smokeless tobacco. She reports that she does not drink alcohol or use drugs.   ROS:  Please see the history of present illness. Otherwise, complete review of systems is positive for fatigue.  All other systems are reviewed and negative.   Physical Exam: VS:  BP 129/71  Pulse (!) 57   Ht 5\' 4"  (1.626 m)   Wt 244 lb 9.6 oz (110.9 kg)   LMP  (LMP Unknown)   BMI 41.99 kg/m , BMI Body mass index is 41.99 kg/m.  Wt Readings from Last 3 Encounters:  03/03/17 244 lb 9.6 oz (110.9 kg)  09/03/16 235 lb (106.6 kg)  02/06/16 230 lb (104.3 kg)    Obese woman, appears comfortable at rest. HEENT: Conjunctiva and lids normal, oropharynx clear.  Neck: Supple, no elevated JVP or carotid bruits, no thyromegaly.  Lungs: Clear to auscultation, nonlabored breathing at rest.  Cardiac: Regular rate and rhythm, no S3 or significant systolic murmur, no pericardial rub.  Extremities: No pitting edema, distal pulses 2+. Skin: Warm and dry. Musculoskeletal: No kyphosis. Neuropsychiatric: Alert and oriented  3, affect appropriate.  ECG: I personally reviewed the tracing from 09/03/2016 which shows sinus bradycardia with prolonged PR interval and poor R wave progression.  Recent Labwork:  December 2017: BUN 21, creatinine 1.14, potassium 3.9, Hgb 12.1, platelets 254  Assessment and Plan:  1. Paroxysmal atrial fibrillation/flutter. She is in sinus rhythm today, ECG intervals overall stable. Plan is to continue current regimen, requesting lab work from Dr. Woody Seller.  2. Essential hypertension, blood pressure control is adequate today. No changes were made. Keep follow-up with Dr. Woody Seller.  3. OSA on CPAP. Patient reports compliance.  4. Fatigue, transient dyspnea on exertion. She has not undergone recent cardiac structural assessment, echocardiogram will be obtained.   Current medicines were reviewed with the patient today.   Orders Placed This Encounter  Procedures  . EKG 12-Lead  . ECHOCARDIOGRAM COMPLETE    Disposition: Follow-up in 6 months.  Signed, Satira Sark, MD, [facility] 03/03/2017 12:14 PM    [place] [facility] [place], [place], [place] [address] Phone: [phone]; Fax: [phone]

## 2017-03-03 ENCOUNTER — Telehealth: Payer: Self-pay | Admitting: Cardiology

## 2017-03-03 ENCOUNTER — Encounter: Payer: Self-pay | Admitting: Cardiology

## 2017-03-03 ENCOUNTER — Ambulatory Visit (INDEPENDENT_AMBULATORY_CARE_PROVIDER_SITE_OTHER): Payer: Medicare Other | Admitting: Cardiology

## 2017-03-03 VITALS — BP 129/71 | HR 57 | Ht 64.0 in | Wt 244.6 lb

## 2017-03-03 DIAGNOSIS — R0602 Shortness of breath: Secondary | ICD-10-CM

## 2017-03-03 DIAGNOSIS — Z9989 Dependence on other enabling machines and devices: Secondary | ICD-10-CM | POA: Diagnosis not present

## 2017-03-03 DIAGNOSIS — G4733 Obstructive sleep apnea (adult) (pediatric): Secondary | ICD-10-CM | POA: Diagnosis not present

## 2017-03-03 DIAGNOSIS — E559 Vitamin D deficiency, unspecified: Secondary | ICD-10-CM | POA: Diagnosis not present

## 2017-03-03 DIAGNOSIS — E7800 Pure hypercholesterolemia, unspecified: Secondary | ICD-10-CM | POA: Diagnosis not present

## 2017-03-03 DIAGNOSIS — I480 Paroxysmal atrial fibrillation: Secondary | ICD-10-CM

## 2017-03-03 DIAGNOSIS — I10 Essential (primary) hypertension: Secondary | ICD-10-CM

## 2017-03-03 DIAGNOSIS — Z79899 Other long term (current) drug therapy: Secondary | ICD-10-CM | POA: Diagnosis not present

## 2017-03-03 NOTE — Telephone Encounter (Signed)
ECHO DX SOB Scheduled in [facility] March 19, 2017

## 2017-03-03 NOTE — Patient Instructions (Signed)

## 2017-03-18 DIAGNOSIS — M899 Disorder of bone, unspecified: Secondary | ICD-10-CM | POA: Diagnosis not present

## 2017-03-19 ENCOUNTER — Ambulatory Visit (INDEPENDENT_AMBULATORY_CARE_PROVIDER_SITE_OTHER): Payer: Medicare Other

## 2017-03-19 ENCOUNTER — Other Ambulatory Visit: Payer: Self-pay

## 2017-03-19 DIAGNOSIS — R0602 Shortness of breath: Secondary | ICD-10-CM

## 2017-03-19 LAB — ECHOCARDIOGRAM COMPLETE
CHL CUP DOP CALC LVOT VTI: 28.7 cm
CHL CUP MV DEC (S): 261
CHL CUP RV SYS PRESS: 15 mmHg
CHL CUP STROKE VOLUME: 48 mL
CHL CUP TV REG PEAK VELOCITY: 172 cm/s
EERAT: 17.55
EWDT: 261 ms
FS: 42 % (ref 28–44)
IV/PV OW: 1.1
LA ID, A-P, ES: 45 mm
LA vol A4C: 69.9 ml
LA vol: 63 mL
LADIAMINDEX: 2.11 cm/m2
LAVOLIN: 29.6 mL/m2
LEFT ATRIUM END SYS DIAM: 45 mm
LV PW d: 9.86 mm — AB (ref 0.6–1.1)
LV SIMPSON'S DISK: 73
LV dias vol: 65 mL (ref 46–106)
LV e' LATERAL: 6.38 cm/s
LV sys vol index: 8 mL/m2
LVDIAVOLIN: 30 mL/m2
LVEEAVG: 17.55
LVEEMED: 17.55
LVOT SV: 90 mL
LVOT area: 3.14 cm2
LVOT diameter: 20 mm
LVOT peak vel: 103 cm/s
LVSYSVOL: 17 mL (ref 14–42)
MV pk A vel: 108 m/s
MVPG: 5 mmHg
MVPKEVEL: 112 m/s
P 1/2 time: 1257 ms
RV LATERAL S' VELOCITY: 10.3 cm/s
TAPSE: 24.3 mm
TDI e' lateral: 6.38
TDI e' medial: 5.88
TR max vel: 172 cm/s

## 2017-03-20 ENCOUNTER — Telehealth: Payer: Self-pay | Admitting: *Deleted

## 2017-03-20 NOTE — Telephone Encounter (Signed)
Patient informed and copy sent to PCP. 

## 2017-03-20 NOTE — Telephone Encounter (Signed)
-----   Message from Satira Sark, MD sent at 03/19/2017  4:53 PM EDT ----- Results reviewed. Please let her know that LVEF remains normal range. A copy of this test should be forwarded to Glenda Chroman, MD.

## 2017-04-08 DIAGNOSIS — E1122 Type 2 diabetes mellitus with diabetic chronic kidney disease: Secondary | ICD-10-CM | POA: Diagnosis not present

## 2017-04-08 DIAGNOSIS — Z713 Dietary counseling and surveillance: Secondary | ICD-10-CM | POA: Diagnosis not present

## 2017-04-08 DIAGNOSIS — E7800 Pure hypercholesterolemia, unspecified: Secondary | ICD-10-CM | POA: Diagnosis not present

## 2017-04-08 DIAGNOSIS — Z299 Encounter for prophylactic measures, unspecified: Secondary | ICD-10-CM | POA: Diagnosis not present

## 2017-04-08 DIAGNOSIS — Z6841 Body Mass Index (BMI) 40.0 and over, adult: Secondary | ICD-10-CM | POA: Diagnosis not present

## 2017-04-08 DIAGNOSIS — Z87891 Personal history of nicotine dependence: Secondary | ICD-10-CM | POA: Diagnosis not present

## 2017-04-08 DIAGNOSIS — I10 Essential (primary) hypertension: Secondary | ICD-10-CM | POA: Diagnosis not present

## 2017-04-08 DIAGNOSIS — N183 Chronic kidney disease, stage 3 (moderate): Secondary | ICD-10-CM | POA: Diagnosis not present

## 2017-04-08 DIAGNOSIS — I4891 Unspecified atrial fibrillation: Secondary | ICD-10-CM | POA: Diagnosis not present

## 2017-04-10 DIAGNOSIS — L609 Nail disorder, unspecified: Secondary | ICD-10-CM | POA: Diagnosis not present

## 2017-04-10 DIAGNOSIS — E1151 Type 2 diabetes mellitus with diabetic peripheral angiopathy without gangrene: Secondary | ICD-10-CM | POA: Diagnosis not present

## 2017-04-10 DIAGNOSIS — L110 Acquired keratosis follicularis: Secondary | ICD-10-CM | POA: Diagnosis not present

## 2017-04-10 DIAGNOSIS — E1140 Type 2 diabetes mellitus with diabetic neuropathy, unspecified: Secondary | ICD-10-CM | POA: Diagnosis not present

## 2017-05-02 ENCOUNTER — Other Ambulatory Visit: Payer: Self-pay | Admitting: Cardiology

## 2017-05-26 ENCOUNTER — Other Ambulatory Visit: Payer: Self-pay | Admitting: Cardiology

## 2017-05-29 DIAGNOSIS — E7800 Pure hypercholesterolemia, unspecified: Secondary | ICD-10-CM | POA: Diagnosis not present

## 2017-05-29 DIAGNOSIS — E1165 Type 2 diabetes mellitus with hyperglycemia: Secondary | ICD-10-CM | POA: Diagnosis not present

## 2017-05-29 DIAGNOSIS — I10 Essential (primary) hypertension: Secondary | ICD-10-CM | POA: Diagnosis not present

## 2017-06-27 DIAGNOSIS — Z23 Encounter for immunization: Secondary | ICD-10-CM | POA: Diagnosis not present

## 2017-07-10 DIAGNOSIS — L110 Acquired keratosis follicularis: Secondary | ICD-10-CM | POA: Diagnosis not present

## 2017-07-10 DIAGNOSIS — E1151 Type 2 diabetes mellitus with diabetic peripheral angiopathy without gangrene: Secondary | ICD-10-CM | POA: Diagnosis not present

## 2017-07-10 DIAGNOSIS — L609 Nail disorder, unspecified: Secondary | ICD-10-CM | POA: Diagnosis not present

## 2017-07-10 DIAGNOSIS — E1140 Type 2 diabetes mellitus with diabetic neuropathy, unspecified: Secondary | ICD-10-CM | POA: Diagnosis not present

## 2017-07-15 DIAGNOSIS — Z299 Encounter for prophylactic measures, unspecified: Secondary | ICD-10-CM | POA: Diagnosis not present

## 2017-07-15 DIAGNOSIS — I10 Essential (primary) hypertension: Secondary | ICD-10-CM | POA: Diagnosis not present

## 2017-07-15 DIAGNOSIS — Z6841 Body Mass Index (BMI) 40.0 and over, adult: Secondary | ICD-10-CM | POA: Diagnosis not present

## 2017-07-15 DIAGNOSIS — E1122 Type 2 diabetes mellitus with diabetic chronic kidney disease: Secondary | ICD-10-CM | POA: Diagnosis not present

## 2017-07-15 DIAGNOSIS — E1165 Type 2 diabetes mellitus with hyperglycemia: Secondary | ICD-10-CM | POA: Diagnosis not present

## 2017-07-15 DIAGNOSIS — I4891 Unspecified atrial fibrillation: Secondary | ICD-10-CM | POA: Diagnosis not present

## 2017-09-01 ENCOUNTER — Ambulatory Visit: Payer: Medicare Other | Admitting: Cardiology

## 2017-09-04 DIAGNOSIS — S92301A Fracture of unspecified metatarsal bone(s), right foot, initial encounter for closed fracture: Secondary | ICD-10-CM | POA: Diagnosis not present

## 2017-09-07 ENCOUNTER — Other Ambulatory Visit: Payer: Self-pay | Admitting: Cardiology

## 2017-09-25 DIAGNOSIS — L609 Nail disorder, unspecified: Secondary | ICD-10-CM | POA: Diagnosis not present

## 2017-09-25 DIAGNOSIS — E1140 Type 2 diabetes mellitus with diabetic neuropathy, unspecified: Secondary | ICD-10-CM | POA: Diagnosis not present

## 2017-09-25 DIAGNOSIS — L110 Acquired keratosis follicularis: Secondary | ICD-10-CM | POA: Diagnosis not present

## 2017-09-25 DIAGNOSIS — E1151 Type 2 diabetes mellitus with diabetic peripheral angiopathy without gangrene: Secondary | ICD-10-CM | POA: Diagnosis not present

## 2017-10-01 DIAGNOSIS — E1165 Type 2 diabetes mellitus with hyperglycemia: Secondary | ICD-10-CM | POA: Diagnosis not present

## 2017-10-01 DIAGNOSIS — I10 Essential (primary) hypertension: Secondary | ICD-10-CM | POA: Diagnosis not present

## 2017-10-01 DIAGNOSIS — E7800 Pure hypercholesterolemia, unspecified: Secondary | ICD-10-CM | POA: Diagnosis not present

## 2017-10-03 ENCOUNTER — Other Ambulatory Visit: Payer: Self-pay | Admitting: *Deleted

## 2017-10-03 MED ORDER — RIVAROXABAN 20 MG PO TABS: ORAL_TABLET | ORAL | 3 refills | Status: DC

## 2017-10-08 ENCOUNTER — Other Ambulatory Visit: Payer: Self-pay | Admitting: Cardiology

## 2017-10-08 DIAGNOSIS — S92301D Fracture of unspecified metatarsal bone(s), right foot, subsequent encounter for fracture with routine healing: Secondary | ICD-10-CM | POA: Diagnosis not present

## 2017-10-10 NOTE — Progress Notes (Signed)
Cardiology Office Note  Date: 10/13/2017   ID: [name], DOB 09/03/1936, MRN [id number]  PCP: Glenda Chroman, MD  Primary Cardiologist: Rozann Lesches, MD   Chief Complaint  Patient presents with  . Atrial Fibrillation    History of Present Illness: [name] is a 82 y.o. female last seen in June 2018. She presents today for a routine follow-up visit. She does not report any palpitations or chest pain. States that she has had some falls related to tripping, but no lightheadedness or syncope. She states that she is having some trouble with her balance and is planning to resume exercise at the [facility] to try and strengthen her legs.  I reviewed her medications. She continues on flecainide, Norvasc, HCTZ, Lopressor, and Xarelto. She reports no bleeding problems. We did discuss obtaining follow-up lab work.  Follow-up echocardiogram in June 2018 showed normal LVEF in the range of 60-65%.  Past Medical History:  Diagnosis Date  . Atrial fibrillation ([place])   . Atrial flutter ([place])   . Essential hypertension, benign   . GERD (gastroesophageal reflux disease)   . OSA on CPAP     Past Surgical History:  Procedure Laterality Date  . ABDOMINAL HYSTERECTOMY     Precancerous lesion  . CHOLECYSTECTOMY      Current Outpatient Medications  Medication Sig Dispense Refill  . amLODipine (NORVASC) 10 MG tablet Take 10 mg by mouth daily.    Marland Kitchen doxazosin (CARDURA) 8 MG tablet Take 4 mg by mouth at bedtime.    . flecainide (TAMBOCOR) 100 MG tablet TAKE 1 TABLET BY MOUTH TWICE A DAY 60 tablet 0  . gemfibrozil (LOPID) 600 MG tablet Take 600 mg by mouth 2 (two) times daily before a meal.      . hydrochlorothiazide 25 MG tablet Take 25 mg by mouth daily.      . insulin aspart (NOVOLOG) 100 UNIT/ML FlexPen Inject 10 Units into the skin 2 (two) times daily.    . Insulin Glargine (TOUJEO SOLOSTAR [place]) Inject 44 Units into the skin at bedtime.     . metformin (FORTAMET) 500 MG (OSM) 24 hr  tablet Take 500 mg by mouth 2 (two) times daily with a meal.     . metoprolol (LOPRESSOR) 50 MG tablet Take 50 mg by mouth 2 (two) times daily.      Marland Kitchen omeprazole (PRILOSEC) 20 MG capsule Take 20 mg by mouth daily.      . rivaroxaban (XARELTO) 20 MG TABS tablet TAKE 1 TABLET BY MOUTH WITH SUPPER ONCE A DAY 30 tablet 3  . venlafaxine XR (EFFEXOR-XR) 75 MG 24 hr capsule Take 75 mg by mouth daily.      No current facility-administered medications for this visit.    Allergies:  Amoxicillin and Codeine phosphate   Social History: The patient  reports that she quit smoking about 39 years ago. Her smoking use included cigarettes. She started smoking about 61 years ago. she has never used smokeless tobacco. She reports that she does not drink alcohol or use drugs.   ROS:  Please see the history of present illness. Otherwise, complete review of systems is positive for trouble with balance.  All other systems are reviewed and negative.   Physical Exam: VS:  BP (!) 144/70   Pulse (!) 58   Ht 5\' 5"  (1.651 m)   Wt 232 lb (105.2 kg)   LMP  (LMP Unknown)   SpO2 95%   BMI 38.61 kg/m ,  BMI Body mass index is 38.61 kg/m.  Wt Readings from Last 3 Encounters:  10/13/17 232 lb (105.2 kg)  03/03/17 244 lb 9.6 oz (110.9 kg)  09/03/16 235 lb (106.6 kg)    General: Elderly woman, appears comfortable at rest. HEENT: Conjunctiva and lids normal, oropharynx clear. Neck: Supple, no elevated JVP or carotid bruits, no thyromegaly. Lungs: Clear to auscultation, nonlabored breathing at rest. Cardiac: Regular rate and rhythm, no S3 or significant systolic murmur, no pericardial rub. Abdomen: Soft, nontender, bowel sounds present. Extremities: No pitting edema, distal pulses 2+. Skin: Warm and dry. Musculoskeletal: No kyphosis. Neuropsychiatric: Alert and oriented x3, affect grossly appropriate.  ECG: I personally reviewed the tracing from 03/03/2017 which showed sinus rhythm with prolonged PR interval, PVCs,  and poor R-wave progression.   Recent Labwork:  June 2018: Hemoglobin 12.1, platelets 271, TSH 4.28, BUN 18, creatinine 1.15, potassium 3.9, AST 17, ALT 18, cholesterol 143, triglycerides 78, HDL 42, LDL 85  Other Studies Reviewed Today:  Echocardiogram 03/19/2017: Study Conclusions  - Left ventricle: The cavity size was normal. Wall thickness was   normal. Systolic function was normal. The estimated ejection   fraction was in the range of 60% to 65%. Wall motion was normal;   there were no regional wall motion abnormalities. Diastolic   dysfunction, grade indeterminate. Elevated filling pressures. - Aortic valve: Trileaflet; mildly thickened leaflets. There was   mild regurgitation. - Mitral valve: Mildly calcified annulus. Mildly thickened leaflets. There was mild regurgitation. - Left atrium: The atrium was mildly dilated. - Right ventricle: Systolic function was mildly reduced.  Assessment and Plan:  1. Paroxysmal atrial fibrillation/flutter. She is doing well without recurrent palpitations on present medical regimen. Follow-up CBC and BMET on Xarelto.  2. Essential hypertension, no changes made to present regimen. Keep follow-up with Dr. Woody Seller.  3. OSA on CPAP.  4. Type 2 diabetes mellitus, following with endocrinologist in [place].  Current medicines were reviewed with the patient today.   Orders Placed This Encounter  Procedures  . CBC  . Basic metabolic panel    Disposition: Follow-up in 6 months.  Signed, Satira Sark, MD, [facility] 10/13/2017 12:13 PM    [place] [facility] [place], [place], [place] [address] Phone: [phone]; Fax: [phone]

## 2017-10-13 ENCOUNTER — Ambulatory Visit (INDEPENDENT_AMBULATORY_CARE_PROVIDER_SITE_OTHER): Payer: Medicare Other | Admitting: Cardiology

## 2017-10-13 ENCOUNTER — Encounter: Payer: Self-pay | Admitting: Cardiology

## 2017-10-13 VITALS — BP 144/70 | HR 58 | Ht 65.0 in | Wt 232.0 lb

## 2017-10-13 DIAGNOSIS — I10 Essential (primary) hypertension: Secondary | ICD-10-CM

## 2017-10-13 DIAGNOSIS — G4733 Obstructive sleep apnea (adult) (pediatric): Secondary | ICD-10-CM

## 2017-10-13 DIAGNOSIS — E119 Type 2 diabetes mellitus without complications: Secondary | ICD-10-CM | POA: Diagnosis not present

## 2017-10-13 DIAGNOSIS — Z794 Long term (current) use of insulin: Secondary | ICD-10-CM

## 2017-10-13 DIAGNOSIS — Z9989 Dependence on other enabling machines and devices: Secondary | ICD-10-CM

## 2017-10-13 DIAGNOSIS — I480 Paroxysmal atrial fibrillation: Secondary | ICD-10-CM | POA: Diagnosis not present

## 2017-10-13 NOTE — Patient Instructions (Signed)
Medication Instructions:  Your physician recommends that you continue on your current medications as directed. Please refer to the Current Medication list given to you today.  Labwork: CBC, BMET Orders given today  Testing/Procedures: NONE  Follow-Up: Your physician wants you to follow-up in: 6 MONTHS WITH DR. Domenic Polite You will receive a reminder letter in the mail two months in advance. If you don't receive a letter, please call our office to schedule the follow-up appointment.  Any Other Special Instructions Will Be Listed Below (If Applicable).  If you need a refill on your cardiac medications before your next appointment, please call your pharmacy.

## 2017-10-15 DIAGNOSIS — C130 Malignant neoplasm of postcricoid region: Secondary | ICD-10-CM | POA: Diagnosis not present

## 2017-10-15 DIAGNOSIS — I10 Essential (primary) hypertension: Secondary | ICD-10-CM | POA: Diagnosis not present

## 2017-10-15 DIAGNOSIS — E1165 Type 2 diabetes mellitus with hyperglycemia: Secondary | ICD-10-CM | POA: Diagnosis not present

## 2017-10-15 DIAGNOSIS — N183 Chronic kidney disease, stage 3 (moderate): Secondary | ICD-10-CM | POA: Diagnosis not present

## 2017-10-15 DIAGNOSIS — Z87891 Personal history of nicotine dependence: Secondary | ICD-10-CM | POA: Diagnosis not present

## 2017-10-15 DIAGNOSIS — E1122 Type 2 diabetes mellitus with diabetic chronic kidney disease: Secondary | ICD-10-CM | POA: Diagnosis not present

## 2017-10-15 DIAGNOSIS — Z6839 Body mass index (BMI) 39.0-39.9, adult: Secondary | ICD-10-CM | POA: Diagnosis not present

## 2017-10-15 DIAGNOSIS — Z299 Encounter for prophylactic measures, unspecified: Secondary | ICD-10-CM | POA: Diagnosis not present

## 2017-10-15 DIAGNOSIS — I4891 Unspecified atrial fibrillation: Secondary | ICD-10-CM | POA: Diagnosis not present

## 2017-10-17 ENCOUNTER — Telehealth: Payer: Self-pay

## 2017-10-17 NOTE — Telephone Encounter (Signed)
-----   Message from Acquanetta Chain, LPN sent at 3/55/2174  8:12 AM EST -----   ----- Message ----- From: Satira Sark, MD Sent: 10/16/2017   7:53 PM To: Merlene Laughter, LPN  Results reviewed. Continue with current regimen. A copy of this test should be forwarded to Glenda Chroman, MD.

## 2017-10-17 NOTE — Telephone Encounter (Signed)
LMTCB

## 2017-10-20 NOTE — Telephone Encounter (Signed)
Patient notified. Routed to PCP 

## 2017-10-20 NOTE — Telephone Encounter (Signed)
-----   Message from Acquanetta Chain, LPN sent at 10/17/5807  8:12 AM EST -----   ----- Message ----- From: Satira Sark, MD Sent: 10/16/2017   7:53 PM To: Merlene Laughter, LPN  Results reviewed. Continue with current regimen. A copy of this test should be forwarded to Glenda Chroman, MD.

## 2017-11-05 ENCOUNTER — Other Ambulatory Visit: Payer: Self-pay | Admitting: Cardiology

## 2017-12-10 DIAGNOSIS — L609 Nail disorder, unspecified: Secondary | ICD-10-CM | POA: Diagnosis not present

## 2017-12-10 DIAGNOSIS — E1151 Type 2 diabetes mellitus with diabetic peripheral angiopathy without gangrene: Secondary | ICD-10-CM | POA: Diagnosis not present

## 2017-12-10 DIAGNOSIS — E1140 Type 2 diabetes mellitus with diabetic neuropathy, unspecified: Secondary | ICD-10-CM | POA: Diagnosis not present

## 2017-12-10 DIAGNOSIS — L110 Acquired keratosis follicularis: Secondary | ICD-10-CM | POA: Diagnosis not present

## 2017-12-27 ENCOUNTER — Other Ambulatory Visit: Payer: Self-pay | Admitting: Cardiology

## 2018-01-14 DIAGNOSIS — Z6838 Body mass index (BMI) 38.0-38.9, adult: Secondary | ICD-10-CM | POA: Diagnosis not present

## 2018-01-14 DIAGNOSIS — I4891 Unspecified atrial fibrillation: Secondary | ICD-10-CM | POA: Diagnosis not present

## 2018-01-14 DIAGNOSIS — N183 Chronic kidney disease, stage 3 (moderate): Secondary | ICD-10-CM | POA: Diagnosis not present

## 2018-01-14 DIAGNOSIS — I10 Essential (primary) hypertension: Secondary | ICD-10-CM | POA: Diagnosis not present

## 2018-01-14 DIAGNOSIS — E1165 Type 2 diabetes mellitus with hyperglycemia: Secondary | ICD-10-CM | POA: Diagnosis not present

## 2018-01-14 DIAGNOSIS — Z299 Encounter for prophylactic measures, unspecified: Secondary | ICD-10-CM | POA: Diagnosis not present

## 2018-01-14 DIAGNOSIS — E1122 Type 2 diabetes mellitus with diabetic chronic kidney disease: Secondary | ICD-10-CM | POA: Diagnosis not present

## 2018-02-09 DIAGNOSIS — E119 Type 2 diabetes mellitus without complications: Secondary | ICD-10-CM | POA: Diagnosis not present

## 2018-02-09 DIAGNOSIS — Z794 Long term (current) use of insulin: Secondary | ICD-10-CM | POA: Diagnosis not present

## 2018-02-25 DIAGNOSIS — L609 Nail disorder, unspecified: Secondary | ICD-10-CM | POA: Diagnosis not present

## 2018-02-25 DIAGNOSIS — L110 Acquired keratosis follicularis: Secondary | ICD-10-CM | POA: Diagnosis not present

## 2018-02-25 DIAGNOSIS — E1140 Type 2 diabetes mellitus with diabetic neuropathy, unspecified: Secondary | ICD-10-CM | POA: Diagnosis not present

## 2018-02-25 DIAGNOSIS — E1151 Type 2 diabetes mellitus with diabetic peripheral angiopathy without gangrene: Secondary | ICD-10-CM | POA: Diagnosis not present

## 2018-03-03 DIAGNOSIS — E7800 Pure hypercholesterolemia, unspecified: Secondary | ICD-10-CM | POA: Diagnosis not present

## 2018-03-03 DIAGNOSIS — Z6838 Body mass index (BMI) 38.0-38.9, adult: Secondary | ICD-10-CM | POA: Diagnosis not present

## 2018-03-03 DIAGNOSIS — Z1339 Encounter for screening examination for other mental health and behavioral disorders: Secondary | ICD-10-CM | POA: Diagnosis not present

## 2018-03-03 DIAGNOSIS — Z0000 Encounter for general adult medical examination without abnormal findings: Secondary | ICD-10-CM | POA: Diagnosis not present

## 2018-03-03 DIAGNOSIS — N183 Chronic kidney disease, stage 3 (moderate): Secondary | ICD-10-CM | POA: Diagnosis not present

## 2018-03-03 DIAGNOSIS — Z299 Encounter for prophylactic measures, unspecified: Secondary | ICD-10-CM | POA: Diagnosis not present

## 2018-03-03 DIAGNOSIS — Z1331 Encounter for screening for depression: Secondary | ICD-10-CM | POA: Diagnosis not present

## 2018-03-03 DIAGNOSIS — Z79899 Other long term (current) drug therapy: Secondary | ICD-10-CM | POA: Diagnosis not present

## 2018-03-03 DIAGNOSIS — I4891 Unspecified atrial fibrillation: Secondary | ICD-10-CM | POA: Diagnosis not present

## 2018-03-03 DIAGNOSIS — Z1211 Encounter for screening for malignant neoplasm of colon: Secondary | ICD-10-CM | POA: Diagnosis not present

## 2018-03-03 DIAGNOSIS — E1122 Type 2 diabetes mellitus with diabetic chronic kidney disease: Secondary | ICD-10-CM | POA: Diagnosis not present

## 2018-03-03 DIAGNOSIS — E1165 Type 2 diabetes mellitus with hyperglycemia: Secondary | ICD-10-CM | POA: Diagnosis not present

## 2018-03-03 DIAGNOSIS — Z1231 Encounter for screening mammogram for malignant neoplasm of breast: Secondary | ICD-10-CM | POA: Diagnosis not present

## 2018-03-03 DIAGNOSIS — Z7189 Other specified counseling: Secondary | ICD-10-CM | POA: Diagnosis not present

## 2018-03-06 DIAGNOSIS — E7800 Pure hypercholesterolemia, unspecified: Secondary | ICD-10-CM | POA: Diagnosis not present

## 2018-03-06 DIAGNOSIS — E1165 Type 2 diabetes mellitus with hyperglycemia: Secondary | ICD-10-CM | POA: Diagnosis not present

## 2018-03-06 DIAGNOSIS — I10 Essential (primary) hypertension: Secondary | ICD-10-CM | POA: Diagnosis not present

## 2018-03-25 ENCOUNTER — Other Ambulatory Visit: Payer: Self-pay | Admitting: Cardiology

## 2018-03-29 ENCOUNTER — Other Ambulatory Visit: Payer: Self-pay | Admitting: Cardiology

## 2018-04-21 NOTE — Progress Notes (Signed)
Cardiology Office Note  Date: 04/22/2018   ID: BRION HEDGES, DOB 1936-04-27, MRN [id number]  PCP: Glenda Chroman, MD  Primary Cardiologist: Rozann Lesches, MD   Chief Complaint  Patient presents with  . Atrial Fibrillation    History of Present Illness: [name] is a 82 y.o. female last seen in January.  She is here for a routine follow-up visit.  Reports no palpitations or chest pain.  States that she has been compliant with her medications.  I reviewed interval lab work from January.  He does not report any bleeding problems on Xarelto.  We discussed obtaining follow-up studies.  I personally reviewed her ECG today which shows sinus bradycardia with indistinct P waves, prolonged PR interval, low voltage and decreased R wave progression with nonspecific T wave changes.  Heart rate is in the low 50s today.  We plan to reduce her Lopressor to 25 mg twice daily.  Past Medical History:  Diagnosis Date  . Atrial fibrillation ([place])   . Atrial flutter ([place])   . Essential hypertension, benign   . GERD (gastroesophageal reflux disease)   . OSA on CPAP     Past Surgical History:  Procedure Laterality Date  . ABDOMINAL HYSTERECTOMY     Precancerous lesion  . CHOLECYSTECTOMY      Current Outpatient Medications  Medication Sig Dispense Refill  . amLODipine (NORVASC) 10 MG tablet Take 10 mg by mouth daily.    Marland Kitchen doxazosin (CARDURA) 8 MG tablet Take 4 mg by mouth at bedtime.    . flecainide (TAMBOCOR) 100 MG tablet TAKE 1 TABLET BY MOUTH TWICE A DAY 60 tablet 0  . gemfibrozil (LOPID) 600 MG tablet Take 600 mg by mouth 2 (two) times daily before a meal.      . hydrochlorothiazide 25 MG tablet Take 25 mg by mouth daily.      . insulin aspart (NOVOLOG) 100 UNIT/ML FlexPen Inject 10 Units into the skin 2 (two) times daily.    . Insulin Glargine (TOUJEO SOLOSTAR [place]) Inject 44 Units into the skin at bedtime.     . metformin (FORTAMET) 500 MG (OSM) 24 hr tablet Take 1,500 mg by  mouth. 2 in the morning and 1 in the evening    . omeprazole (PRILOSEC) 20 MG capsule Take 20 mg by mouth daily.      Marland Kitchen venlafaxine XR (EFFEXOR-XR) 75 MG 24 hr capsule Take 75 mg by mouth daily.     Alveda Reasons 20 MG TABS tablet TAKE 1 TABLET BY MOUTH WITH SUPPER ONCE A DAY 90 tablet 1  . metoprolol tartrate (LOPRESSOR) 25 MG tablet Take 1 tablet (25 mg total) by mouth 2 (two) times daily. 180 tablet 1   No current facility-administered medications for this visit.    Allergies:  Amoxicillin and Codeine phosphate   Social History: The patient  reports that she quit smoking about 39 years ago. Her smoking use included cigarettes. She started smoking about 61 years ago. She has never used smokeless tobacco. She reports that she does not drink alcohol or use drugs.   ROS:  Please see the history of present illness. Otherwise, complete review of systems is positive for hearing loss.  All other systems are reviewed and negative.   Physical Exam: VS:  BP (!) 118/52   Pulse (!) 53   Ht 5\' 5"  (1.651 m)   Wt 231 lb (104.8 kg)   LMP  (LMP Unknown)   SpO2 96%  BMI 38.44 kg/m , BMI Body mass index is 38.44 kg/m.  Wt Readings from Last 3 Encounters:  04/22/18 231 lb (104.8 kg)  10/13/17 232 lb (105.2 kg)  03/03/17 244 lb 9.6 oz (110.9 kg)    General: Elderly woman, appears comfortable at rest. HEENT: Conjunctiva and lids normal, oropharynx clear. Neck: Supple, no elevated JVP or carotid bruits, no thyromegaly. Lungs: Clear to auscultation, nonlabored breathing at rest. Cardiac: Regular rate and rhythm, no S3 or significant systolic murmur. Abdomen: Soft, nontender, bowel sounds present. Extremities: No pitting edema, distal pulses 2+. Skin: Warm and dry. Musculoskeletal: No kyphosis. Neuropsychiatric: Alert and oriented x3, affect grossly appropriate.  ECG: I personally reviewed the tracing from 03/03/2017 which showed sinus rhythm with prolonged PR interval, PVCs, and poor R-wave  progression.  Recent Labwork:  June 2018: Hemoglobin 12.1, platelets 271, TSH 4.28, BUN 18, creatinine 1.15, potassium 3.9, AST 17, ALT 18, cholesterol 143, triglycerides 78, HDL 42, LDL 85 January 2019: Hemoglobin 12.5, BUN 13, creatinine 1.18, potassium 3.6  Other Studies Reviewed Today:  Echocardiogram 03/19/2017: Study Conclusions  - Left ventricle: The cavity size was normal. Wall thickness was normal. Systolic function was normal. The estimated ejection fraction was in the range of 60% to 65%. Wall motion was normal; there were no regional wall motion abnormalities. Diastolic dysfunction, grade indeterminate. Elevated filling pressures. - Aortic valve: Trileaflet; mildly thickened leaflets. There was mild regurgitation. - Mitral valve: Mildly calcified annulus. Mildly thickened leaflets. There was mild regurgitation. - Left atrium: The atrium was mildly dilated. - Right ventricle: Systolic function was mildly reduced.  Assessment and Plan:  1.  Paroxysmal atrial fibrillation.  She is symptomatically stable at this time.  With low heart rates we will reduce Lopressor to 25 mg twice daily, otherwise continue flecainide and Xarelto.  Follow-up CBC and BMET.  2.  Essential hypertension, blood pressure is well controlled today.  3.  OSA on CPAP.  4.  Type 2 diabetes mellitus, continues to follow with endocrinology.  Current medicines were reviewed with the patient today.   Orders Placed This Encounter  Procedures  . CBC  . Basic Metabolic Panel (BMET)  . EKG 12-Lead    Disposition: Follow-up in 6 months.  Signed, Satira Sark, MD, [facility] 04/22/2018 2:43 PM    [facility] at [place], [place], [place] [address] Phone: [phone]; Fax: [phone]

## 2018-04-22 ENCOUNTER — Encounter: Payer: Self-pay | Admitting: Cardiology

## 2018-04-22 ENCOUNTER — Ambulatory Visit (INDEPENDENT_AMBULATORY_CARE_PROVIDER_SITE_OTHER): Payer: Medicare Other | Admitting: Cardiology

## 2018-04-22 VITALS — BP 118/52 | HR 53 | Ht 65.0 in | Wt 231.0 lb

## 2018-04-22 DIAGNOSIS — Z794 Long term (current) use of insulin: Secondary | ICD-10-CM

## 2018-04-22 DIAGNOSIS — G4733 Obstructive sleep apnea (adult) (pediatric): Secondary | ICD-10-CM | POA: Diagnosis not present

## 2018-04-22 DIAGNOSIS — Z9989 Dependence on other enabling machines and devices: Secondary | ICD-10-CM | POA: Diagnosis not present

## 2018-04-22 DIAGNOSIS — E119 Type 2 diabetes mellitus without complications: Secondary | ICD-10-CM

## 2018-04-22 DIAGNOSIS — I480 Paroxysmal atrial fibrillation: Secondary | ICD-10-CM

## 2018-04-22 DIAGNOSIS — I10 Essential (primary) hypertension: Secondary | ICD-10-CM

## 2018-04-22 MED ORDER — METOPROLOL TARTRATE 25 MG PO TABS: 25.0000 mg | ORAL_TABLET | Freq: Two times a day (BID) | ORAL | 1 refills | Status: DC

## 2018-04-22 NOTE — Patient Instructions (Signed)
Your physician wants you to follow-up in: [name] will receive a reminder letter in the mail two months in advance. If you don't receive a letter, please call our office to schedule the follow-up appointment.  Your physician has recommended you make the following change in your medication:   DECREASE LOPRESSOR 25 MG TWICE DAILY  Your physician recommends that you return for lab work CBC/BMP  Thank you for choosing [facility]!!

## 2018-04-27 ENCOUNTER — Other Ambulatory Visit: Payer: Self-pay | Admitting: Cardiology

## 2018-05-04 DIAGNOSIS — I4891 Unspecified atrial fibrillation: Secondary | ICD-10-CM | POA: Diagnosis not present

## 2018-05-04 DIAGNOSIS — E7800 Pure hypercholesterolemia, unspecified: Secondary | ICD-10-CM | POA: Diagnosis not present

## 2018-05-04 DIAGNOSIS — I10 Essential (primary) hypertension: Secondary | ICD-10-CM | POA: Diagnosis not present

## 2018-05-04 DIAGNOSIS — E119 Type 2 diabetes mellitus without complications: Secondary | ICD-10-CM | POA: Diagnosis not present

## 2018-05-05 DIAGNOSIS — I480 Paroxysmal atrial fibrillation: Secondary | ICD-10-CM | POA: Diagnosis not present

## 2018-05-06 DIAGNOSIS — L110 Acquired keratosis follicularis: Secondary | ICD-10-CM | POA: Diagnosis not present

## 2018-05-06 DIAGNOSIS — E1151 Type 2 diabetes mellitus with diabetic peripheral angiopathy without gangrene: Secondary | ICD-10-CM | POA: Diagnosis not present

## 2018-05-06 DIAGNOSIS — L609 Nail disorder, unspecified: Secondary | ICD-10-CM | POA: Diagnosis not present

## 2018-05-06 DIAGNOSIS — E1140 Type 2 diabetes mellitus with diabetic neuropathy, unspecified: Secondary | ICD-10-CM | POA: Diagnosis not present

## 2018-05-11 ENCOUNTER — Telehealth: Payer: Self-pay | Admitting: *Deleted

## 2018-05-11 NOTE — Telephone Encounter (Signed)
-----   Message from Satira Sark, MD sent at 05/06/2018 10:03 AM EDT ----- Results reviewed.  Renal function and hemoglobin remain normal.  Continue with current medical regimen. A copy of this test should be forwarded to Glenda Chroman, MD.

## 2018-05-11 NOTE — Telephone Encounter (Signed)
Pt aware - routed to pcp  

## 2018-06-05 DIAGNOSIS — Z6838 Body mass index (BMI) 38.0-38.9, adult: Secondary | ICD-10-CM | POA: Diagnosis not present

## 2018-06-05 DIAGNOSIS — E1165 Type 2 diabetes mellitus with hyperglycemia: Secondary | ICD-10-CM | POA: Diagnosis not present

## 2018-06-05 DIAGNOSIS — Z299 Encounter for prophylactic measures, unspecified: Secondary | ICD-10-CM | POA: Diagnosis not present

## 2018-06-05 DIAGNOSIS — I10 Essential (primary) hypertension: Secondary | ICD-10-CM | POA: Diagnosis not present

## 2018-06-05 DIAGNOSIS — E1122 Type 2 diabetes mellitus with diabetic chronic kidney disease: Secondary | ICD-10-CM | POA: Diagnosis not present

## 2018-06-05 DIAGNOSIS — R609 Edema, unspecified: Secondary | ICD-10-CM | POA: Diagnosis not present

## 2018-06-19 DIAGNOSIS — I4891 Unspecified atrial fibrillation: Secondary | ICD-10-CM | POA: Diagnosis not present

## 2018-06-19 DIAGNOSIS — E7800 Pure hypercholesterolemia, unspecified: Secondary | ICD-10-CM | POA: Diagnosis not present

## 2018-06-19 DIAGNOSIS — I10 Essential (primary) hypertension: Secondary | ICD-10-CM | POA: Diagnosis not present

## 2018-06-19 DIAGNOSIS — E119 Type 2 diabetes mellitus without complications: Secondary | ICD-10-CM | POA: Diagnosis not present

## 2018-07-06 DIAGNOSIS — E1165 Type 2 diabetes mellitus with hyperglycemia: Secondary | ICD-10-CM | POA: Diagnosis not present

## 2018-07-06 DIAGNOSIS — I10 Essential (primary) hypertension: Secondary | ICD-10-CM | POA: Diagnosis not present

## 2018-07-06 DIAGNOSIS — E7800 Pure hypercholesterolemia, unspecified: Secondary | ICD-10-CM | POA: Diagnosis not present

## 2018-07-06 DIAGNOSIS — Z23 Encounter for immunization: Secondary | ICD-10-CM | POA: Diagnosis not present

## 2018-07-17 DIAGNOSIS — E119 Type 2 diabetes mellitus without complications: Secondary | ICD-10-CM | POA: Diagnosis not present

## 2018-07-17 DIAGNOSIS — I10 Essential (primary) hypertension: Secondary | ICD-10-CM | POA: Diagnosis not present

## 2018-07-17 DIAGNOSIS — I4891 Unspecified atrial fibrillation: Secondary | ICD-10-CM | POA: Diagnosis not present

## 2018-07-17 DIAGNOSIS — E7800 Pure hypercholesterolemia, unspecified: Secondary | ICD-10-CM | POA: Diagnosis not present

## 2018-07-22 DIAGNOSIS — E1140 Type 2 diabetes mellitus with diabetic neuropathy, unspecified: Secondary | ICD-10-CM | POA: Diagnosis not present

## 2018-07-22 DIAGNOSIS — L609 Nail disorder, unspecified: Secondary | ICD-10-CM | POA: Diagnosis not present

## 2018-07-22 DIAGNOSIS — L110 Acquired keratosis follicularis: Secondary | ICD-10-CM | POA: Diagnosis not present

## 2018-07-22 DIAGNOSIS — E1151 Type 2 diabetes mellitus with diabetic peripheral angiopathy without gangrene: Secondary | ICD-10-CM | POA: Diagnosis not present

## 2018-08-23 ENCOUNTER — Other Ambulatory Visit: Payer: Self-pay | Admitting: Cardiology

## 2018-09-04 DIAGNOSIS — Z6838 Body mass index (BMI) 38.0-38.9, adult: Secondary | ICD-10-CM | POA: Diagnosis not present

## 2018-09-04 DIAGNOSIS — E1122 Type 2 diabetes mellitus with diabetic chronic kidney disease: Secondary | ICD-10-CM | POA: Diagnosis not present

## 2018-09-04 DIAGNOSIS — E1165 Type 2 diabetes mellitus with hyperglycemia: Secondary | ICD-10-CM | POA: Diagnosis not present

## 2018-09-04 DIAGNOSIS — I10 Essential (primary) hypertension: Secondary | ICD-10-CM | POA: Diagnosis not present

## 2018-09-04 DIAGNOSIS — Z299 Encounter for prophylactic measures, unspecified: Secondary | ICD-10-CM | POA: Diagnosis not present

## 2018-09-04 DIAGNOSIS — I4891 Unspecified atrial fibrillation: Secondary | ICD-10-CM | POA: Diagnosis not present

## 2018-10-17 ENCOUNTER — Other Ambulatory Visit: Payer: Self-pay | Admitting: Cardiology

## 2018-10-21 NOTE — Progress Notes (Signed)
Cardiology Office Note  Date: 10/22/2018   ID: [name], DOB 1936-06-29, MRN [id number]  PCP: Glenda Chroman, MD  Primary Cardiologist: Rozann Lesches, MD   Chief Complaint  Patient presents with  . Atrial Fibrillation    History of Present Illness: [name] is an 83 y.o. female last seen in July 2019.  She is here for a routine visit.  She does not report any significant palpitations or chest pain.  No syncope.  Still active in terms of ADLs, also travels.  Lopressor dose was reduced at the last visit.  She is due for follow-up CBC and BMET on Xarelto.  She does not report any bleeding episodes.  Past Medical History:  Diagnosis Date  . Atrial fibrillation ([place])   . Atrial flutter ([place])   . Essential hypertension, benign   . GERD (gastroesophageal reflux disease)   . OSA on CPAP     Past Surgical History:  Procedure Laterality Date  . ABDOMINAL HYSTERECTOMY     Precancerous lesion  . CHOLECYSTECTOMY      Current Outpatient Medications  Medication Sig Dispense Refill  . amLODipine (NORVASC) 10 MG tablet Take 10 mg by mouth daily.    Marland Kitchen doxazosin (CARDURA) 8 MG tablet Take 4 mg by mouth at bedtime.    . flecainide (TAMBOCOR) 100 MG tablet TAKE 1 TABLET BY MOUTH TWICE A DAY 60 tablet 6  . gemfibrozil (LOPID) 600 MG tablet Take 600 mg by mouth 2 (two) times daily before a meal.      . hydrochlorothiazide 25 MG tablet Take 25 mg by mouth daily.      . insulin aspart (NOVOLOG) 100 UNIT/ML FlexPen Inject 10 Units into the skin 2 (two) times daily.    . Insulin Glargine (TOUJEO SOLOSTAR [place]) Inject 44 Units into the skin at bedtime.     . metformin (FORTAMET) 500 MG (OSM) 24 hr tablet Take 1,500 mg by mouth. 2 in the morning and 1 in the evening    . metoprolol tartrate (LOPRESSOR) 25 MG tablet TAKE 1 TABLET BY MOUTH TWICE A DAY 180 tablet 1  . omeprazole (PRILOSEC) 20 MG capsule Take 20 mg by mouth daily.      Marland Kitchen venlafaxine XR (EFFEXOR-XR) 75 MG 24 hr capsule  Take 75 mg by mouth daily.     Alveda Reasons 20 MG TABS tablet TAKE 1 TABLET BY MOUTH WITH SUPPER ONCE A DAY 90 tablet 0   No current facility-administered medications for this visit.    Allergies:  Amoxicillin and Codeine phosphate   Social History: The patient  reports that she quit smoking about 40 years ago. Her smoking use included cigarettes. She started smoking about 62 years ago. She has never used smokeless tobacco. She reports that she does not drink alcohol or use drugs.   ROS:  Please see the history of present illness. Otherwise, complete review of systems is positive for none.  All other systems are reviewed and negative.   Physical Exam: VS:  BP (!) 151/74   Pulse 79   Ht 5\' 4"  (1.626 m)   Wt 225 lb 12.8 oz (102.4 kg)   LMP  (LMP Unknown)   SpO2 94%   BMI 38.76 kg/m , BMI Body mass index is 38.76 kg/m.  Wt Readings from Last 3 Encounters:  10/22/18 225 lb 12.8 oz (102.4 kg)  04/22/18 231 lb (104.8 kg)  10/13/17 232 lb (105.2 kg)    General: Elderly woman, appears  comfortable at rest. HEENT: Conjunctiva and lids normal, oropharynx clear. Neck: Supple, no elevated JVP or carotid bruits, no thyromegaly. Lungs: Clear to auscultation, nonlabored breathing at rest. Cardiac: Regular rate and rhythm, no S3 or significant systolic murmur. Abdomen: Soft, nontender, bowel sounds present. Extremities: No pitting edema, distal pulses 2+. Skin: Warm and dry. Musculoskeletal: No kyphosis. Neuropsychiatric: Alert and oriented x3, affect grossly appropriate.  ECG: I personally reviewed the tracing from 04/22/2018 which showed sinus bradycardia with indistinct P waves, prolonged PR interval, low voltage and decreased R wave progression, nonspecific T wave changes.  Recent Labwork:  August 2019: BUN 11, creatinine 1.1, potassium 3.6, hemoglobin 12.8, platelets 272  Other Studies Reviewed Today:  Echocardiogram 03/19/2017: Study Conclusions  - Left ventricle: The cavity size  was normal. Wall thickness was normal. Systolic function was normal. The estimated ejection fraction was in the range of 60% to 65%. Wall motion was normal; there were no regional wall motion abnormalities. Diastolic dysfunction, grade indeterminate. Elevated filling pressures. - Aortic valve: Trileaflet; mildly thickened leaflets. There was mild regurgitation. - Mitral valve: Mildly calcified annulus. Mildly thickened leaflets. There was mild regurgitation. - Left atrium: The atrium was mildly dilated. - Right ventricle: Systolic function was mildly reduced.  Assessment and Plan:  1.  Paroxysmal atrial fibrillation, doing well and asymptomatic on current medical regimen including Lopressor, flecainide, and Xarelto.  Follow-up CBC and BMET.  2.  Essential hypertension, blood pressure elevated today.  She reports compliance with her medications.  Keep follow-up with Dr. Woody Seller.  3.  OSA on CPAP.  4.  Type 2 diabetes mellitus, following with endocrinology.  Current medicines were reviewed with the patient today.   Orders Placed This Encounter  Procedures  . Basic metabolic panel  . CBC    Disposition: Follow-up in 6 months.  Signed, Satira Sark, MD, [facility] 10/22/2018 12:09 PM    [facility] at [place], [place], [place] [address] Phone: [phone]; Fax: [phone]

## 2018-10-22 ENCOUNTER — Ambulatory Visit (INDEPENDENT_AMBULATORY_CARE_PROVIDER_SITE_OTHER): Payer: Medicare Other | Admitting: Cardiology

## 2018-10-22 ENCOUNTER — Encounter: Payer: Self-pay | Admitting: Cardiology

## 2018-10-22 VITALS — BP 151/74 | HR 79 | Ht 64.0 in | Wt 225.8 lb

## 2018-10-22 DIAGNOSIS — E119 Type 2 diabetes mellitus without complications: Secondary | ICD-10-CM | POA: Diagnosis not present

## 2018-10-22 DIAGNOSIS — Z79899 Other long term (current) drug therapy: Secondary | ICD-10-CM | POA: Diagnosis not present

## 2018-10-22 DIAGNOSIS — I480 Paroxysmal atrial fibrillation: Secondary | ICD-10-CM | POA: Diagnosis not present

## 2018-10-22 DIAGNOSIS — G4733 Obstructive sleep apnea (adult) (pediatric): Secondary | ICD-10-CM

## 2018-10-22 DIAGNOSIS — Z9989 Dependence on other enabling machines and devices: Secondary | ICD-10-CM | POA: Diagnosis not present

## 2018-10-22 DIAGNOSIS — Z794 Long term (current) use of insulin: Secondary | ICD-10-CM

## 2018-10-22 DIAGNOSIS — I10 Essential (primary) hypertension: Secondary | ICD-10-CM | POA: Diagnosis not present

## 2018-10-22 NOTE — Patient Instructions (Addendum)
Medication Instructions:   Your physician recommends that you continue on your current medications as directed. Please refer to the Current Medication list given to you today.  Labwork:  Your physician recommends that you return for lab work in: to check your BMET & CBC.  Testing/Procedures:  NONE  Follow-Up:  Your physician recommends that you schedule a follow-up appointment in: 6 months. You will receive a reminder letter in the mail in about 4 months reminding you to call and schedule your appointment. If you don't receive this letter, please contact our office.  Any Other Special Instructions Will Be Listed Below (If Applicable).  If you need a refill on your cardiac medications before your next appointment, please call your pharmacy.

## 2018-10-23 DIAGNOSIS — I480 Paroxysmal atrial fibrillation: Secondary | ICD-10-CM | POA: Diagnosis not present

## 2018-10-23 DIAGNOSIS — Z79899 Other long term (current) drug therapy: Secondary | ICD-10-CM | POA: Diagnosis not present

## 2018-10-24 ENCOUNTER — Other Ambulatory Visit: Payer: Self-pay | Admitting: Cardiology

## 2018-10-27 ENCOUNTER — Telehealth: Payer: Self-pay | Admitting: *Deleted

## 2018-10-27 NOTE — Telephone Encounter (Signed)
-----   Message from Satira Sark, MD sent at 10/26/2018  9:36 AM EST ----- Results reviewed.  Hemoglobin normal. A copy of this test should be forwarded to Glenda Chroman, MD.

## 2018-10-27 NOTE — Telephone Encounter (Signed)
Patient informed. Copy sent to PCP °

## 2018-10-27 NOTE — Telephone Encounter (Signed)
-----   Message from Satira Sark, MD sent at 10/23/2018  1:55 PM EST ----- Results reviewed.  Renal function and potassium stable.  CBC pending. A copy of this test should be forwarded to Glenda Chroman, MD.

## 2018-10-28 DIAGNOSIS — E1140 Type 2 diabetes mellitus with diabetic neuropathy, unspecified: Secondary | ICD-10-CM | POA: Diagnosis not present

## 2018-10-28 DIAGNOSIS — L609 Nail disorder, unspecified: Secondary | ICD-10-CM | POA: Diagnosis not present

## 2018-10-28 DIAGNOSIS — E1151 Type 2 diabetes mellitus with diabetic peripheral angiopathy without gangrene: Secondary | ICD-10-CM | POA: Diagnosis not present

## 2018-10-28 DIAGNOSIS — L110 Acquired keratosis follicularis: Secondary | ICD-10-CM | POA: Diagnosis not present

## 2018-11-09 DIAGNOSIS — E7800 Pure hypercholesterolemia, unspecified: Secondary | ICD-10-CM | POA: Diagnosis not present

## 2018-11-09 DIAGNOSIS — I10 Essential (primary) hypertension: Secondary | ICD-10-CM | POA: Diagnosis not present

## 2018-11-09 DIAGNOSIS — E1165 Type 2 diabetes mellitus with hyperglycemia: Secondary | ICD-10-CM | POA: Diagnosis not present

## 2018-12-09 ENCOUNTER — Other Ambulatory Visit: Payer: Self-pay | Admitting: Cardiology

## 2018-12-22 DIAGNOSIS — E7800 Pure hypercholesterolemia, unspecified: Secondary | ICD-10-CM | POA: Diagnosis not present

## 2018-12-22 DIAGNOSIS — E1165 Type 2 diabetes mellitus with hyperglycemia: Secondary | ICD-10-CM | POA: Diagnosis not present

## 2018-12-22 DIAGNOSIS — I10 Essential (primary) hypertension: Secondary | ICD-10-CM | POA: Diagnosis not present

## 2019-01-13 DIAGNOSIS — L609 Nail disorder, unspecified: Secondary | ICD-10-CM | POA: Diagnosis not present

## 2019-01-13 DIAGNOSIS — E1140 Type 2 diabetes mellitus with diabetic neuropathy, unspecified: Secondary | ICD-10-CM | POA: Diagnosis not present

## 2019-01-13 DIAGNOSIS — E1151 Type 2 diabetes mellitus with diabetic peripheral angiopathy without gangrene: Secondary | ICD-10-CM | POA: Diagnosis not present

## 2019-01-13 DIAGNOSIS — L110 Acquired keratosis follicularis: Secondary | ICD-10-CM | POA: Diagnosis not present

## 2019-02-19 DIAGNOSIS — E113292 Type 2 diabetes mellitus with mild nonproliferative diabetic retinopathy without macular edema, left eye: Secondary | ICD-10-CM | POA: Diagnosis not present

## 2019-02-19 DIAGNOSIS — Z794 Long term (current) use of insulin: Secondary | ICD-10-CM | POA: Diagnosis not present

## 2019-02-19 DIAGNOSIS — Z961 Presence of intraocular lens: Secondary | ICD-10-CM | POA: Diagnosis not present

## 2019-03-30 DIAGNOSIS — E7800 Pure hypercholesterolemia, unspecified: Secondary | ICD-10-CM | POA: Diagnosis not present

## 2019-03-30 DIAGNOSIS — E1165 Type 2 diabetes mellitus with hyperglycemia: Secondary | ICD-10-CM | POA: Diagnosis not present

## 2019-03-30 DIAGNOSIS — I10 Essential (primary) hypertension: Secondary | ICD-10-CM | POA: Diagnosis not present

## 2019-04-12 DIAGNOSIS — L110 Acquired keratosis follicularis: Secondary | ICD-10-CM | POA: Diagnosis not present

## 2019-04-12 DIAGNOSIS — L609 Nail disorder, unspecified: Secondary | ICD-10-CM | POA: Diagnosis not present

## 2019-04-12 DIAGNOSIS — E1151 Type 2 diabetes mellitus with diabetic peripheral angiopathy without gangrene: Secondary | ICD-10-CM | POA: Diagnosis not present

## 2019-04-12 DIAGNOSIS — E1140 Type 2 diabetes mellitus with diabetic neuropathy, unspecified: Secondary | ICD-10-CM | POA: Diagnosis not present

## 2019-04-28 ENCOUNTER — Other Ambulatory Visit: Payer: Self-pay

## 2019-04-28 MED ORDER — METOPROLOL TARTRATE 25 MG PO TABS: 25.0000 mg | ORAL_TABLET | Freq: Two times a day (BID) | ORAL | 3 refills | Status: DC

## 2019-04-28 NOTE — Telephone Encounter (Signed)
Medication refill

## 2019-06-30 DIAGNOSIS — L609 Nail disorder, unspecified: Secondary | ICD-10-CM | POA: Diagnosis not present

## 2019-06-30 DIAGNOSIS — E1140 Type 2 diabetes mellitus with diabetic neuropathy, unspecified: Secondary | ICD-10-CM | POA: Diagnosis not present

## 2019-06-30 DIAGNOSIS — L110 Acquired keratosis follicularis: Secondary | ICD-10-CM | POA: Diagnosis not present

## 2019-06-30 DIAGNOSIS — E1151 Type 2 diabetes mellitus with diabetic peripheral angiopathy without gangrene: Secondary | ICD-10-CM | POA: Diagnosis not present

## 2019-07-07 DIAGNOSIS — Z1211 Encounter for screening for malignant neoplasm of colon: Secondary | ICD-10-CM | POA: Diagnosis not present

## 2019-07-07 DIAGNOSIS — R5383 Other fatigue: Secondary | ICD-10-CM | POA: Diagnosis not present

## 2019-07-07 DIAGNOSIS — Z1331 Encounter for screening for depression: Secondary | ICD-10-CM | POA: Diagnosis not present

## 2019-07-07 DIAGNOSIS — I10 Essential (primary) hypertension: Secondary | ICD-10-CM | POA: Diagnosis not present

## 2019-07-07 DIAGNOSIS — Z299 Encounter for prophylactic measures, unspecified: Secondary | ICD-10-CM | POA: Diagnosis not present

## 2019-07-07 DIAGNOSIS — Z6836 Body mass index (BMI) 36.0-36.9, adult: Secondary | ICD-10-CM | POA: Diagnosis not present

## 2019-07-07 DIAGNOSIS — Z79899 Other long term (current) drug therapy: Secondary | ICD-10-CM | POA: Diagnosis not present

## 2019-07-07 DIAGNOSIS — Z0000 Encounter for general adult medical examination without abnormal findings: Secondary | ICD-10-CM | POA: Diagnosis not present

## 2019-07-07 DIAGNOSIS — Z1339 Encounter for screening examination for other mental health and behavioral disorders: Secondary | ICD-10-CM | POA: Diagnosis not present

## 2019-07-07 DIAGNOSIS — Z7189 Other specified counseling: Secondary | ICD-10-CM | POA: Diagnosis not present

## 2019-07-07 DIAGNOSIS — E7800 Pure hypercholesterolemia, unspecified: Secondary | ICD-10-CM | POA: Diagnosis not present

## 2019-07-07 DIAGNOSIS — E559 Vitamin D deficiency, unspecified: Secondary | ICD-10-CM | POA: Diagnosis not present

## 2019-07-07 DIAGNOSIS — E1165 Type 2 diabetes mellitus with hyperglycemia: Secondary | ICD-10-CM | POA: Diagnosis not present

## 2019-07-09 DIAGNOSIS — Z23 Encounter for immunization: Secondary | ICD-10-CM | POA: Diagnosis not present

## 2019-08-03 DIAGNOSIS — E7800 Pure hypercholesterolemia, unspecified: Secondary | ICD-10-CM | POA: Diagnosis not present

## 2019-08-03 DIAGNOSIS — E1165 Type 2 diabetes mellitus with hyperglycemia: Secondary | ICD-10-CM | POA: Diagnosis not present

## 2019-08-03 DIAGNOSIS — Z794 Long term (current) use of insulin: Secondary | ICD-10-CM | POA: Diagnosis not present

## 2019-08-03 DIAGNOSIS — I10 Essential (primary) hypertension: Secondary | ICD-10-CM | POA: Diagnosis not present

## 2019-08-10 DIAGNOSIS — Z1231 Encounter for screening mammogram for malignant neoplasm of breast: Secondary | ICD-10-CM | POA: Diagnosis not present

## 2019-09-03 ENCOUNTER — Ambulatory Visit (INDEPENDENT_AMBULATORY_CARE_PROVIDER_SITE_OTHER): Payer: Medicare Other | Admitting: Family Medicine

## 2019-09-03 ENCOUNTER — Encounter: Payer: Self-pay | Admitting: Cardiology

## 2019-09-03 ENCOUNTER — Other Ambulatory Visit: Payer: Self-pay

## 2019-09-03 VITALS — BP 111/62 | HR 63 | Ht 64.0 in | Wt 203.2 lb

## 2019-09-03 DIAGNOSIS — Z794 Long term (current) use of insulin: Secondary | ICD-10-CM | POA: Diagnosis not present

## 2019-09-03 DIAGNOSIS — E119 Type 2 diabetes mellitus without complications: Secondary | ICD-10-CM | POA: Diagnosis not present

## 2019-09-03 DIAGNOSIS — G4733 Obstructive sleep apnea (adult) (pediatric): Secondary | ICD-10-CM

## 2019-09-03 DIAGNOSIS — I10 Essential (primary) hypertension: Secondary | ICD-10-CM | POA: Diagnosis not present

## 2019-09-03 DIAGNOSIS — Z9989 Dependence on other enabling machines and devices: Secondary | ICD-10-CM | POA: Diagnosis not present

## 2019-09-03 DIAGNOSIS — I480 Paroxysmal atrial fibrillation: Secondary | ICD-10-CM | POA: Diagnosis not present

## 2019-09-03 NOTE — Progress Notes (Addendum)
Cardiology Office Note  Date: 09/03/2019   ID: [name], DOB [birth year], MRN [id number]  PCP:  Glenda Chroman, MD  Cardiologist:  Rozann Lesches, MD Electrophysiologist:  None   Chief Complaint  Patient presents with  . Follow-up    Paroxysmal atrial fib, hypertension    History of Present Illness: [name] is a 83 y.o. female last visit with Dr. Domenic Polite  October 22, 2018. History of atrial fibrillation / atrial flutter, hypertension, obstructive sleep apnea.  At previous visits patient denied any sensation of palpitations, or chest pain. Denied any syncope. She is still active and performing her activities of daily living and occasionally traveling.   She denies any recent acute illnesses, hospitalizations, surgeries, tick bites, or travels.  Denies any palpitations, arrhythmias, syncope or near syncopal episodes.  EKG today shows atrial fibrillation with left anterior fascicular block with a rate of 62.  Patient states life has been fairly stressful recently due to the death of her son and she has custody of his 43 year old daughter.  Patient states she is seeing an endocrinologist in [place] and diabetes has not been well controlled recently.  States she cannot remember what her last hemoglobin A1c was but it was greater than 6 per her statement.  States she has been socially isolated and depressed recently due to current events in her family and associated social distancing due to the pandemic.  She states prior to the pandemic she was very active in social activities.  She admits to a fair amount of fatigue which she attributes to the same circumstances.  Past Medical History:  Diagnosis Date  . Atrial fibrillation ([place])   . Atrial flutter ([place])   . Essential hypertension, benign   . GERD (gastroesophageal reflux disease)   . OSA on CPAP     Past Surgical History:  Procedure Laterality Date  . ABDOMINAL HYSTERECTOMY     Precancerous lesion  .  CHOLECYSTECTOMY      Current Outpatient Medications  Medication Sig Dispense Refill  . amLODipine (NORVASC) 10 MG tablet Take 10 mg by mouth daily.    Marland Kitchen doxazosin (CARDURA) 8 MG tablet Take 4 mg by mouth at bedtime.    . flecainide (TAMBOCOR) 100 MG tablet TAKE 1 TABLET BY MOUTH TWICE A DAY 180 tablet 3  . gemfibrozil (LOPID) 600 MG tablet Take 600 mg by mouth 2 (two) times daily before a meal.      . hydrochlorothiazide 25 MG tablet Take 25 mg by mouth daily.      . insulin aspart (NOVOLOG) 100 UNIT/ML FlexPen Inject 10 Units into the skin 2 (two) times daily.    . Insulin Glargine (TOUJEO SOLOSTAR [place]) Inject 44 Units into the skin at bedtime.     . metformin (FORTAMET) 500 MG (OSM) 24 hr tablet Take 1,500 mg by mouth. 2 in the morning and 1 in the evening    . metoprolol tartrate (LOPRESSOR) 25 MG tablet Take 1 tablet (25 mg total) by mouth 2 (two) times daily. 180 tablet 3  . omeprazole (PRILOSEC) 20 MG capsule Take 20 mg by mouth daily.      Marland Kitchen venlafaxine XR (EFFEXOR-XR) 75 MG 24 hr capsule Take 75 mg by mouth daily.     Alveda Reasons 20 MG TABS tablet TAKE 1 TABLET BY MOUTH WITH SUPPER ONCE A DAY 90 tablet 2   No current facility-administered medications for this visit.   Allergies:  Amoxicillin and Codeine phosphate   Social  History: The patient  reports that she quit smoking about 40 years ago. Her smoking use included cigarettes. She started smoking about 63 years ago. She has never used smokeless tobacco. She reports that she does not drink alcohol or use drugs.   Family History: The patient's family history includes Heart failure in her mother; Lung cancer in her father.   ROS:  Please see the history of present illness. Otherwise, complete review of systems is positive for none.  All other systems are reviewed and negative.   Physical Exam:  VS:  BP 111/62   Pulse 63   Ht 5\' 4"  (1.626 m)   Wt 203 lb 3.2 oz (92.2 kg)   LMP  (LMP Unknown)   SpO2 96%   BMI 34.88 kg/m , BMI  Body mass index is 34.88 kg/m.  Wt Readings from Last 3 Encounters:  09/03/19 203 lb 3.2 oz (92.2 kg)  10/22/18 225 lb 12.8 oz (102.4 kg)  04/22/18 231 lb (104.8 kg)    General: Patient appears comfortable at rest. Neck: Supple, no elevated JVP or carotid bruits, no thyromegaly. Lungs: Clear to auscultation, nonlabored breathing at rest. Cardiac: Irregularly irregular, no S3 or significant systolic murmur, no pericardial rub. Extremities: No pitting edema, distal pulses 2+. Skin: Warm and dry. Neuropsychiatric: Alert and oriented x3, affect grossly appropriate.  ECG:  An ECG dated September 03, 2019. was personally reviewed today and demonstrated:  Atrial fibrillation with left anterior fascicular block rate of 62  Recent Labwork: Most recent lab work in October 23, 2018 from PCP office : glucose 158, creatinine 1.09, GFR 48, sodium 136, potassium 3.6, hemoglobin 12.1, hematocrit 36.2, platelets 264.  Other Studies Reviewed Today:  Echocardiogram 03/19/2017: Study Conclusions  - Left ventricle: The cavity size was normal. Wall thickness was normal. Systolic function was normal. The estimated ejection fraction was in the range of 60% to 65%. Wall motion was normal; there were no regional wall motion abnormalities. Diastolic dysfunction, grade indeterminate. Elevated filling pressures. - Aortic valve: Trileaflet; mildly thickened leaflets. There was mild regurgitation. - Mitral valve: Mildly calcified annulus. Mildly thickened leaflets. There was mild regurgitation. - Left atrium: The atrium was mildly dilated. - Right ventricle: Systolic function was mildly reduced. Assessment and Plan:  1. Paroxysmal atrial fibrillation (HCC)   2. Essential hypertension   3. OSA on CPAP   4. Type 2 diabetes mellitus without complication, with long-term current use of insulin ([place])    1. Paroxysmal atrial fibrillation ([place]) States she has very rare transient episodes of  palpitations.  EKG today shows atrial fibrillation with left anterior fascicular block with a rate of 62.  Continue Xarelto and metoprolol 25 mg p.o. twice daily, flecainide 100 mg twice a day. Get BMP and CMP.  2. Essential hypertension Blood pressure well controlled today on current medication.  BP 111/62.  Continue Cardura 4 mg daily at bedtime, hydrochlorothiazide 25 mg, amlodipine 10 mg daily.  3. OSA on CPAP Patient states she was wearing her CPAP appliance but has now stopped.  Patient states she stopped due to wanting to keep an eye on her granddaughter at night.  She was afraid the noise would keep her from hearing her granddaughter.  Advised patient of the benefits of using CPAP.  4. Type 2 diabetes mellitus without complication, with long-term current use of insulin [facility]) Patient is seeing endocrinologist in [place].  States her blood sugar has not been well controlled recently.  Advised her to see her endocrinologist soon to  follow-up.  Medication Adjustments/Labs and Tests Ordered: Current medicines are reviewed at length with the patient today.  Concerns regarding medicines are outlined above.    Patient Instructions  Your physician wants you to follow-up in: [place] will receive a reminder letter in the mail two months in advance. If you don't receive a letter, please call our office to schedule the follow-up appointment.  Your physician recommends that you continue on your current medications as directed. Please refer to the Current Medication list given to you today.  Your physician recommends that you return for lab work CBC/BMP  Thank you for choosing [facility]!! \         Signed, Levell July, NP 09/03/2019 3:12 PM    [facility] at [place], [place], [place] [address] Phone: [phone]; Fax: [phone]

## 2019-09-03 NOTE — Patient Instructions (Signed)
Your physician wants you to follow-up in: [place] will receive a reminder letter in the mail two months in advance. If you don't receive a letter, please call our office to schedule the follow-up appointment.  Your physician recommends that you continue on your current medications as directed. Please refer to the Current Medication list given to you today.  Your physician recommends that you return for lab work CBC/BMP  Thank you for choosing [facility]!! \

## 2019-09-07 NOTE — Addendum Note (Signed)
Addended by: Laurine Blazer on: 09/07/2019 10:05 AM   Modules accepted: Orders

## 2019-09-10 ENCOUNTER — Other Ambulatory Visit: Payer: Self-pay | Admitting: Cardiology

## 2019-09-15 ENCOUNTER — Telehealth: Payer: Self-pay

## 2019-09-15 MED ORDER — RIVAROXABAN 15 MG PO TABS: 15.0000 mg | ORAL_TABLET | Freq: Every day | ORAL | 6 refills | Status: DC

## 2019-09-15 NOTE — Telephone Encounter (Signed)
-----   Message from Satira Sark, MD sent at 09/14/2019  4:11 PM EST ----- Results reviewed. Creatinine up to 1.58 (creatinine clearance 41), normal potassium and hemoglobin. Would reduce Xarelto to 15 mg daily. Send labwork to PCP Dr. Woody Seller since renal function has changed compared to last year.

## 2019-09-15 NOTE — Telephone Encounter (Signed)
Pt informed, xarelto 15 mg every day e-scribed to pharmacy, copied pcp on labs

## 2019-09-28 DIAGNOSIS — E7800 Pure hypercholesterolemia, unspecified: Secondary | ICD-10-CM | POA: Diagnosis not present

## 2019-09-28 DIAGNOSIS — E119 Type 2 diabetes mellitus without complications: Secondary | ICD-10-CM | POA: Diagnosis not present

## 2019-09-28 DIAGNOSIS — I4891 Unspecified atrial fibrillation: Secondary | ICD-10-CM | POA: Diagnosis not present

## 2019-09-28 DIAGNOSIS — I10 Essential (primary) hypertension: Secondary | ICD-10-CM | POA: Diagnosis not present

## 2019-10-19 DIAGNOSIS — K045 Chronic apical periodontitis: Secondary | ICD-10-CM | POA: Diagnosis not present

## 2019-10-25 ENCOUNTER — Encounter: Payer: Self-pay | Admitting: Dietician

## 2019-10-25 ENCOUNTER — Encounter: Payer: Medicare Other | Attending: Endocrinology | Admitting: Dietician

## 2019-10-25 DIAGNOSIS — E1169 Type 2 diabetes mellitus with other specified complication: Secondary | ICD-10-CM | POA: Diagnosis not present

## 2019-10-25 DIAGNOSIS — Z794 Long term (current) use of insulin: Secondary | ICD-10-CM | POA: Diagnosis not present

## 2019-10-25 NOTE — Patient Instructions (Signed)
Sample meal plans provided are around 45 grams of carbohydrate per meal.  Practice meal planning and counting carbohydrates with foods that you like.  Be sure that you are getting enough fluid.   It is better to use regular sprite or regular gingerale for a low blood sugar rather than dark soda.  Resources: You Tube Exercises:  "Sit and Be Fit" App:  Calorie King (for carbohydrate counting)  Please call when you get your insulin pump (or before if you have questions) and we can review carbohydrate counting.

## 2019-10-25 NOTE — Progress Notes (Signed)
Medical Nutrition Therapy:  Appt start time: 1530 end time:  1640. This visit was completed via telephone due to the COVID-19 pandemic.   I spoke with [name] and verified that I was speaking with the correct person with two patient identifiers (full name and date of birth).   I discussed the limitations related to this kind of visit and the patient is willing to proceed.  Assessment:  Primary concerns today: .  She was referred for pre pump education.  She does not know the name of the pump that she will be getting but has filled out paperwork.    She states that she would like meal planning.  She states that her blood sugar has been extremely high and extremely low lately and would like to get this more controlled. High > 500 last night and thought this was due to an old insulin vial.  She used a new vial this am with improved BG.  Last week she had a "Lo" on her FreeStyle Libre 14 day.  She was symptomatic.  She treats a low BG with Glucose tabs (which did not do anything), then drank a regular 16 oz Coke or a regular soft Peppermint candy.  History includes Type 2 Diabetes for >40 years, pure hypercholesterolemia, HTN.  Medications include:  Vitamin D 50,000 units once per week, Metformin, Novolog before each meal via sliding scale, Novolin NPH 20 units q am, 25 units q HS.    Her son passed away August 23, 2019 and she has custody of her 82 yo Curator.  Her other son is moving into her home.  She reports that she sleeps too much related to this increased stress and grief.  She is taking an antidepressant and wonders if this should be increased.  She also feels increased stress due to not getting out.   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 Her son is doing the cooking currently as he is following a low carbohydrate diet.  Other times she does the shopping and cooking.  Preferred Learning  Style:   No preference indicated   Learning Readiness:   Ready  DIETARY INTAKE:  Doesn't care much for meat. Complains of dental pain for about a month and took an antibiotic for this for more than 1 month.  She had dental surgery last week.  She is taking the Activia daily to increased antibiotic use.    24-hr recall:  B ( AM): 2 slices [employer] toast with cheese, Activia Yogurt, Cranberry juice (diet)  Snk ( AM): canned pineapple (as BG was dropping)  L ( PM): Mrs. Furnow's Stew (canned) Snk ( PM): none D ( PM): Green peas, Bush's country style baked beans, small portion pork, canned pineapple Snk ( PM): 2 yogurt popsicles (15 grams each) Beverages: water, diet cranberry juice, sugar free lemonade  occasional coffee with truvia and occasional cream  Usual physical activity: States that she does not have energy for exercise.  She states that she does best when she is in PT.   Progress Towards Goal(s):  In progress.   Nutritional Diagnosis:  NB-1.1 Food and nutrition-related knowledge deficit As related to Carbohydrate Counting.  As evidenced by patient report.    Intervention:  Nutrition education related to carbohydrate counting.  Discussed carbohydrate counting by portion size, label reading, and apps.  Discussed that this information will be entered into the insulin pump and assist with BG management.  Discussed that basic needs currently without a pump to balance the carbohydrate is approximately 45 grams per meal.  Discussed the affects of food groups on her BG.  Discussed that plant protein can be used as desired rather than meat and can be easier for her kidneys.  Discussed her treatment of low BG.  Plan: Sample meal plans provided are around 45 grams of carbohydrate per meal.  Practice meal planning and counting carbohydrates with foods that you like.  Be sure that you are getting enough fluid.   It is better to use regular sprite or regular gingerale for a low blood sugar  rather than dark soda.  Resources: You Tube Exercises:  "Sit and Be Fit" App:  Calorie King (for carbohydrate counting)  Please call when you get your insulin pump (or before if you have questions) and we can review carbohydrate counting.    Teaching Method Utilized:  Auditory  Handouts given during visit include:  Sample meal plans  Diabetes Your Take Control Guide book  Meal plan card  Label reading  Snack sheet  Planning Healthy Meals from [employer]  Barriers to learning/adherence to lifestyle change: none  Demonstrated degree of understanding via:  Teach Back   Monitoring/Evaluation:  Dietary intake, exercise, label reading, and body weight prn.

## 2019-11-01 DIAGNOSIS — M859 Disorder of bone density and structure, unspecified: Secondary | ICD-10-CM | POA: Diagnosis not present

## 2019-11-01 DIAGNOSIS — N959 Unspecified menopausal and perimenopausal disorder: Secondary | ICD-10-CM | POA: Diagnosis not present

## 2019-11-01 DIAGNOSIS — E2839 Other primary ovarian failure: Secondary | ICD-10-CM | POA: Diagnosis not present

## 2019-11-02 DIAGNOSIS — Z794 Long term (current) use of insulin: Secondary | ICD-10-CM | POA: Diagnosis not present

## 2019-11-02 DIAGNOSIS — I10 Essential (primary) hypertension: Secondary | ICD-10-CM | POA: Diagnosis not present

## 2019-11-02 DIAGNOSIS — E1165 Type 2 diabetes mellitus with hyperglycemia: Secondary | ICD-10-CM | POA: Diagnosis not present

## 2019-11-03 ENCOUNTER — Other Ambulatory Visit: Payer: Self-pay | Admitting: Cardiology

## 2019-11-25 DIAGNOSIS — Z23 Encounter for immunization: Secondary | ICD-10-CM | POA: Diagnosis not present

## 2019-12-09 DIAGNOSIS — I10 Essential (primary) hypertension: Secondary | ICD-10-CM | POA: Diagnosis not present

## 2019-12-09 DIAGNOSIS — E119 Type 2 diabetes mellitus without complications: Secondary | ICD-10-CM | POA: Diagnosis not present

## 2019-12-09 DIAGNOSIS — E7800 Pure hypercholesterolemia, unspecified: Secondary | ICD-10-CM | POA: Diagnosis not present

## 2019-12-09 DIAGNOSIS — I4891 Unspecified atrial fibrillation: Secondary | ICD-10-CM | POA: Diagnosis not present

## 2019-12-22 DIAGNOSIS — E1140 Type 2 diabetes mellitus with diabetic neuropathy, unspecified: Secondary | ICD-10-CM | POA: Diagnosis not present

## 2019-12-22 DIAGNOSIS — L609 Nail disorder, unspecified: Secondary | ICD-10-CM | POA: Diagnosis not present

## 2019-12-22 DIAGNOSIS — L110 Acquired keratosis follicularis: Secondary | ICD-10-CM | POA: Diagnosis not present

## 2019-12-22 DIAGNOSIS — E1151 Type 2 diabetes mellitus with diabetic peripheral angiopathy without gangrene: Secondary | ICD-10-CM | POA: Diagnosis not present

## 2019-12-25 DIAGNOSIS — Z23 Encounter for immunization: Secondary | ICD-10-CM | POA: Diagnosis not present

## 2020-01-03 DIAGNOSIS — E7800 Pure hypercholesterolemia, unspecified: Secondary | ICD-10-CM | POA: Diagnosis not present

## 2020-01-03 DIAGNOSIS — Z794 Long term (current) use of insulin: Secondary | ICD-10-CM | POA: Diagnosis not present

## 2020-01-03 DIAGNOSIS — I10 Essential (primary) hypertension: Secondary | ICD-10-CM | POA: Diagnosis not present

## 2020-01-03 DIAGNOSIS — E1165 Type 2 diabetes mellitus with hyperglycemia: Secondary | ICD-10-CM | POA: Diagnosis not present

## 2020-02-16 DIAGNOSIS — I10 Essential (primary) hypertension: Secondary | ICD-10-CM | POA: Diagnosis not present

## 2020-02-16 DIAGNOSIS — Z299 Encounter for prophylactic measures, unspecified: Secondary | ICD-10-CM | POA: Diagnosis not present

## 2020-02-16 DIAGNOSIS — E1122 Type 2 diabetes mellitus with diabetic chronic kidney disease: Secondary | ICD-10-CM | POA: Diagnosis not present

## 2020-02-16 DIAGNOSIS — E1165 Type 2 diabetes mellitus with hyperglycemia: Secondary | ICD-10-CM | POA: Diagnosis not present

## 2020-02-16 DIAGNOSIS — E559 Vitamin D deficiency, unspecified: Secondary | ICD-10-CM | POA: Diagnosis not present

## 2020-02-16 DIAGNOSIS — R5383 Other fatigue: Secondary | ICD-10-CM | POA: Diagnosis not present

## 2020-02-20 DIAGNOSIS — I4891 Unspecified atrial fibrillation: Secondary | ICD-10-CM | POA: Diagnosis not present

## 2020-02-20 DIAGNOSIS — E119 Type 2 diabetes mellitus without complications: Secondary | ICD-10-CM | POA: Diagnosis not present

## 2020-02-20 DIAGNOSIS — I10 Essential (primary) hypertension: Secondary | ICD-10-CM | POA: Diagnosis not present

## 2020-02-20 DIAGNOSIS — E7800 Pure hypercholesterolemia, unspecified: Secondary | ICD-10-CM | POA: Diagnosis not present

## 2020-03-08 DIAGNOSIS — L110 Acquired keratosis follicularis: Secondary | ICD-10-CM | POA: Diagnosis not present

## 2020-03-08 DIAGNOSIS — E1140 Type 2 diabetes mellitus with diabetic neuropathy, unspecified: Secondary | ICD-10-CM | POA: Diagnosis not present

## 2020-03-08 DIAGNOSIS — E1151 Type 2 diabetes mellitus with diabetic peripheral angiopathy without gangrene: Secondary | ICD-10-CM | POA: Diagnosis not present

## 2020-03-08 DIAGNOSIS — L609 Nail disorder, unspecified: Secondary | ICD-10-CM | POA: Diagnosis not present

## 2020-03-16 ENCOUNTER — Encounter: Payer: Self-pay | Admitting: Cardiology

## 2020-03-16 ENCOUNTER — Other Ambulatory Visit: Payer: Self-pay

## 2020-03-16 ENCOUNTER — Ambulatory Visit (INDEPENDENT_AMBULATORY_CARE_PROVIDER_SITE_OTHER): Payer: Medicare Other | Admitting: Cardiology

## 2020-03-16 ENCOUNTER — Encounter: Payer: Self-pay | Admitting: Physician Assistant

## 2020-03-16 VITALS — BP 114/72 | HR 70 | Ht 65.0 in | Wt 198.0 lb

## 2020-03-16 DIAGNOSIS — I10 Essential (primary) hypertension: Secondary | ICD-10-CM

## 2020-03-16 DIAGNOSIS — I480 Paroxysmal atrial fibrillation: Secondary | ICD-10-CM

## 2020-03-16 NOTE — Progress Notes (Signed)
Cardiology Office Note  Date: 03/16/2020   ID: [name], DOB 07-16-1936, MRN [id number]  PCP:  Stephanie Chroman, MD  Cardiologist:  Stephanie Lesches, MD Electrophysiologist:  None   Chief Complaint  Patient presents with  . Cardiac follow-up    History of Present Illness: [name] is an 84 y.o. female last seen in December 2020 by Mr. Stephanie Sake NP.  She is here today with her granddaughter for a follow-up visit.  From cardiac perspective, she does not report any palpitations or chest pain.  She has been having some recent GI symptoms, abdominal pain, also transient nausea and emesis.  She states that she is scheduled for gastroenterology evaluation soon.  I reviewed her medications which are stable from a cardiac perspective and outlined below.  She does not report any obvious bleeding problems on Xarelto.  I reviewed her lab work from December 2020.  Past Medical History:  Diagnosis Date  . Atrial fibrillation ([place])   . Atrial flutter ([place])   . Diabetes mellitus without complication ([place])   . Essential hypertension, benign   . GERD (gastroesophageal reflux disease)   . Hyperlipidemia   . OSA on CPAP   . Vitamin D deficiency     Past Surgical History:  Procedure Laterality Date  . ABDOMINAL HYSTERECTOMY     Precancerous lesion  . CHOLECYSTECTOMY      Current Outpatient Medications  Medication Sig Dispense Refill  . amLODipine (NORVASC) 10 MG tablet Take 10 mg by mouth daily.    Marland Kitchen doxazosin (CARDURA) 8 MG tablet Take 4 mg by mouth at bedtime.    . flecainide (TAMBOCOR) 100 MG tablet TAKE 1 TABLET BY MOUTH TWICE A DAY 180 tablet 3  . gemfibrozil (LOPID) 600 MG tablet Take 600 mg by mouth 2 (two) times daily before a meal.      . hydrochlorothiazide 25 MG tablet Take 25 mg by mouth daily.      . insulin aspart (NOVOLOG) 100 UNIT/ML FlexPen Inject 10 Units into the skin 2 (two) times daily.    . Insulin Glargine (TOUJEO SOLOSTAR [place]) Inject 44 Units into the skin at  bedtime.     . metformin (FORTAMET) 500 MG (OSM) 24 hr tablet Take 1,500 mg by mouth. 2 in the morning and 1 in the evening    . metoprolol tartrate (LOPRESSOR) 25 MG tablet Take 1 tablet (25 mg total) by mouth 2 (two) times daily. 180 tablet 3  . omeprazole (PRILOSEC) 20 MG capsule Take 20 mg by mouth daily.      . Rivaroxaban (XARELTO) 15 MG TABS tablet Take 1 tablet (15 mg total) by mouth daily. 30 tablet 6  . venlafaxine XR (EFFEXOR-XR) 75 MG 24 hr capsule Take 75 mg by mouth daily.     . Vitamin D, Ergocalciferol, (DRISDOL) 1.25 MG (50000 UNIT) CAPS capsule Take 50,000 Units by mouth every 7 (seven) days.     No current facility-administered medications for this visit.   Allergies:  Amoxicillin and Codeine phosphate   ROS:   No syncope.  Physical Exam: VS:  BP 114/72   Pulse 70   Ht 5\' 5"  (1.651 m)   Wt 198 lb (89.8 kg)   LMP  (LMP Unknown)   SpO2 97%   BMI 32.95 kg/m , BMI Body mass index is 32.95 kg/m.  Wt Readings from Last 3 Encounters:  03/16/20 198 lb (89.8 kg)  10/25/19 199 lb (90.3 kg)  09/03/19 203 lb 3.2  oz (92.2 kg)    General:  Elderly woman, appears comfortable at rest. HEENT: Conjunctiva and lids normal, wearing a mask. Neck: Supple, no elevated JVP or carotid bruits, no thyromegaly. Lungs: Clear to auscultation, nonlabored breathing at rest. Cardiac: Regular rate and rhythm with ectopy, no S3, soft systolic murmur. Extremities: No pitting edema, distal pulses 2+.  ECG:  An ECG dated 09/03/2019 was personally reviewed today and demonstrated:  Sinus rhythm with PACs, left anterior fascicular block.  Recent Labwork:  December 2020: Hemoglobin 11.9, platelets 240, BUN 21, creatinine 1.4, potassium 3.5  Other Studies Reviewed Today:  Echocardiogram 03/19/2017: Study Conclusions   - Left ventricle: The cavity size was normal. Wall thickness was  normal. Systolic function was normal. The estimated ejection  fraction was in the range of 60% to 65%.  Wall motion was normal;  there were no regional wall motion abnormalities. Diastolic  dysfunction, grade indeterminate. Elevated filling pressures.  - Aortic valve: Trileaflet; mildly thickened leaflets. There was  mild regurgitation.  - Mitral valve: Mildly calcified annulus. Mildly thickened leaflets  . There was mild regurgitation.  - Left atrium: The atrium was mildly dilated.  - Right ventricle: Systolic function was mildly reduced.   Assessment and Plan:  1.  Paroxysmal atrial fibrillation.  CHA2DS2-VASc score is 5.  She remains on Xarelto for stroke prophylaxis and has had good rhythm control on combination of Tambocor and Lopressor.  No changes made today.  I reviewed her lab work from December 2020.  2.  Essential hypertension, blood pressure is well controlled today.  Keep follow-up with [name].  No changes were made today.  Medication Adjustments/Labs and Tests Ordered: Current medicines are reviewed at length with the patient today.  Concerns regarding medicines are outlined above.   Tests Ordered: No orders of the defined types were placed in this encounter.   Medication Changes: No orders of the defined types were placed in this encounter.   Disposition:  Follow up 6 months in the [place] office.  Signed, Stephanie Sark, MD, [facility] 03/16/2020 2:44 PM    [facility] at [place], [place], [place] [address] Phone: [phone]; Fax: [phone]

## 2020-03-16 NOTE — Patient Instructions (Addendum)

## 2020-03-22 DIAGNOSIS — E119 Type 2 diabetes mellitus without complications: Secondary | ICD-10-CM | POA: Diagnosis not present

## 2020-03-22 DIAGNOSIS — I4891 Unspecified atrial fibrillation: Secondary | ICD-10-CM | POA: Diagnosis not present

## 2020-03-22 DIAGNOSIS — I10 Essential (primary) hypertension: Secondary | ICD-10-CM | POA: Diagnosis not present

## 2020-03-22 DIAGNOSIS — E7800 Pure hypercholesterolemia, unspecified: Secondary | ICD-10-CM | POA: Diagnosis not present

## 2020-04-03 DIAGNOSIS — I10 Essential (primary) hypertension: Secondary | ICD-10-CM | POA: Diagnosis not present

## 2020-04-03 DIAGNOSIS — Z794 Long term (current) use of insulin: Secondary | ICD-10-CM | POA: Diagnosis not present

## 2020-04-03 DIAGNOSIS — E7800 Pure hypercholesterolemia, unspecified: Secondary | ICD-10-CM | POA: Diagnosis not present

## 2020-04-03 DIAGNOSIS — E1165 Type 2 diabetes mellitus with hyperglycemia: Secondary | ICD-10-CM | POA: Diagnosis not present

## 2020-04-10 ENCOUNTER — Other Ambulatory Visit: Payer: Self-pay | Admitting: Cardiology

## 2020-04-20 ENCOUNTER — Ambulatory Visit: Payer: Medicare Other | Admitting: Physician Assistant

## 2020-04-23 ENCOUNTER — Other Ambulatory Visit: Payer: Self-pay | Admitting: Cardiology

## 2020-05-05 DIAGNOSIS — E113293 Type 2 diabetes mellitus with mild nonproliferative diabetic retinopathy without macular edema, bilateral: Secondary | ICD-10-CM | POA: Diagnosis not present

## 2020-05-05 DIAGNOSIS — Z961 Presence of intraocular lens: Secondary | ICD-10-CM | POA: Diagnosis not present

## 2020-05-09 DIAGNOSIS — I10 Essential (primary) hypertension: Secondary | ICD-10-CM | POA: Diagnosis not present

## 2020-05-09 DIAGNOSIS — E7800 Pure hypercholesterolemia, unspecified: Secondary | ICD-10-CM | POA: Diagnosis not present

## 2020-05-09 DIAGNOSIS — E119 Type 2 diabetes mellitus without complications: Secondary | ICD-10-CM | POA: Diagnosis not present

## 2020-05-09 DIAGNOSIS — I4891 Unspecified atrial fibrillation: Secondary | ICD-10-CM | POA: Diagnosis not present

## 2020-05-10 DIAGNOSIS — I10 Essential (primary) hypertension: Secondary | ICD-10-CM | POA: Diagnosis not present

## 2020-05-10 DIAGNOSIS — Z794 Long term (current) use of insulin: Secondary | ICD-10-CM | POA: Diagnosis not present

## 2020-05-10 DIAGNOSIS — E7800 Pure hypercholesterolemia, unspecified: Secondary | ICD-10-CM | POA: Diagnosis not present

## 2020-05-10 DIAGNOSIS — E1165 Type 2 diabetes mellitus with hyperglycemia: Secondary | ICD-10-CM | POA: Diagnosis not present

## 2020-05-17 DIAGNOSIS — E1151 Type 2 diabetes mellitus with diabetic peripheral angiopathy without gangrene: Secondary | ICD-10-CM | POA: Diagnosis not present

## 2020-05-17 DIAGNOSIS — L110 Acquired keratosis follicularis: Secondary | ICD-10-CM | POA: Diagnosis not present

## 2020-05-17 DIAGNOSIS — L609 Nail disorder, unspecified: Secondary | ICD-10-CM | POA: Diagnosis not present

## 2020-05-17 DIAGNOSIS — E1140 Type 2 diabetes mellitus with diabetic neuropathy, unspecified: Secondary | ICD-10-CM | POA: Diagnosis not present

## 2020-06-22 DIAGNOSIS — I10 Essential (primary) hypertension: Secondary | ICD-10-CM | POA: Diagnosis not present

## 2020-06-22 DIAGNOSIS — I4891 Unspecified atrial fibrillation: Secondary | ICD-10-CM | POA: Diagnosis not present

## 2020-06-22 DIAGNOSIS — E119 Type 2 diabetes mellitus without complications: Secondary | ICD-10-CM | POA: Diagnosis not present

## 2020-06-22 DIAGNOSIS — E7800 Pure hypercholesterolemia, unspecified: Secondary | ICD-10-CM | POA: Diagnosis not present

## 2020-07-13 DIAGNOSIS — Z7189 Other specified counseling: Secondary | ICD-10-CM | POA: Diagnosis not present

## 2020-07-13 DIAGNOSIS — Z0000 Encounter for general adult medical examination without abnormal findings: Secondary | ICD-10-CM | POA: Diagnosis not present

## 2020-07-13 DIAGNOSIS — Z1331 Encounter for screening for depression: Secondary | ICD-10-CM | POA: Diagnosis not present

## 2020-07-13 DIAGNOSIS — I10 Essential (primary) hypertension: Secondary | ICD-10-CM | POA: Diagnosis not present

## 2020-07-13 DIAGNOSIS — Z1339 Encounter for screening examination for other mental health and behavioral disorders: Secondary | ICD-10-CM | POA: Diagnosis not present

## 2020-07-13 DIAGNOSIS — E1165 Type 2 diabetes mellitus with hyperglycemia: Secondary | ICD-10-CM | POA: Diagnosis not present

## 2020-07-13 DIAGNOSIS — Z23 Encounter for immunization: Secondary | ICD-10-CM | POA: Diagnosis not present

## 2020-07-13 DIAGNOSIS — Z6835 Body mass index (BMI) 35.0-35.9, adult: Secondary | ICD-10-CM | POA: Diagnosis not present

## 2020-07-13 DIAGNOSIS — E7800 Pure hypercholesterolemia, unspecified: Secondary | ICD-10-CM | POA: Diagnosis not present

## 2020-07-13 DIAGNOSIS — Z299 Encounter for prophylactic measures, unspecified: Secondary | ICD-10-CM | POA: Diagnosis not present

## 2020-07-13 DIAGNOSIS — R5383 Other fatigue: Secondary | ICD-10-CM | POA: Diagnosis not present

## 2020-07-13 DIAGNOSIS — Z6832 Body mass index (BMI) 32.0-32.9, adult: Secondary | ICD-10-CM | POA: Diagnosis not present

## 2020-07-21 DIAGNOSIS — I10 Essential (primary) hypertension: Secondary | ICD-10-CM | POA: Diagnosis not present

## 2020-07-21 DIAGNOSIS — I4891 Unspecified atrial fibrillation: Secondary | ICD-10-CM | POA: Diagnosis not present

## 2020-07-21 DIAGNOSIS — E119 Type 2 diabetes mellitus without complications: Secondary | ICD-10-CM | POA: Diagnosis not present

## 2020-07-21 DIAGNOSIS — E7800 Pure hypercholesterolemia, unspecified: Secondary | ICD-10-CM | POA: Diagnosis not present

## 2020-07-27 DIAGNOSIS — Z23 Encounter for immunization: Secondary | ICD-10-CM | POA: Diagnosis not present

## 2020-08-02 DIAGNOSIS — D631 Anemia in chronic kidney disease: Secondary | ICD-10-CM | POA: Diagnosis not present

## 2020-08-02 DIAGNOSIS — I10 Essential (primary) hypertension: Secondary | ICD-10-CM | POA: Diagnosis not present

## 2020-08-02 DIAGNOSIS — Z794 Long term (current) use of insulin: Secondary | ICD-10-CM | POA: Diagnosis not present

## 2020-08-02 DIAGNOSIS — I480 Paroxysmal atrial fibrillation: Secondary | ICD-10-CM | POA: Diagnosis not present

## 2020-08-02 DIAGNOSIS — N1832 Chronic kidney disease, stage 3b: Secondary | ICD-10-CM | POA: Diagnosis not present

## 2020-08-02 DIAGNOSIS — E1165 Type 2 diabetes mellitus with hyperglycemia: Secondary | ICD-10-CM | POA: Diagnosis not present

## 2020-08-02 DIAGNOSIS — E7800 Pure hypercholesterolemia, unspecified: Secondary | ICD-10-CM | POA: Diagnosis not present

## 2020-08-02 DIAGNOSIS — G4733 Obstructive sleep apnea (adult) (pediatric): Secondary | ICD-10-CM | POA: Diagnosis not present

## 2020-08-02 DIAGNOSIS — E785 Hyperlipidemia, unspecified: Secondary | ICD-10-CM | POA: Diagnosis not present

## 2020-08-02 DIAGNOSIS — Z9989 Dependence on other enabling machines and devices: Secondary | ICD-10-CM | POA: Diagnosis not present

## 2020-08-02 DIAGNOSIS — N390 Urinary tract infection, site not specified: Secondary | ICD-10-CM | POA: Diagnosis not present

## 2020-08-02 DIAGNOSIS — I129 Hypertensive chronic kidney disease with stage 1 through stage 4 chronic kidney disease, or unspecified chronic kidney disease: Secondary | ICD-10-CM | POA: Diagnosis not present

## 2020-08-02 DIAGNOSIS — R7309 Other abnormal glucose: Secondary | ICD-10-CM | POA: Diagnosis not present

## 2020-08-02 DIAGNOSIS — K219 Gastro-esophageal reflux disease without esophagitis: Secondary | ICD-10-CM | POA: Diagnosis not present

## 2020-08-02 DIAGNOSIS — E1122 Type 2 diabetes mellitus with diabetic chronic kidney disease: Secondary | ICD-10-CM | POA: Diagnosis not present

## 2020-08-03 ENCOUNTER — Other Ambulatory Visit (HOSPITAL_COMMUNITY): Payer: Self-pay | Admitting: Internal Medicine

## 2020-08-03 ENCOUNTER — Other Ambulatory Visit: Payer: Self-pay | Admitting: Internal Medicine

## 2020-08-03 DIAGNOSIS — N1832 Chronic kidney disease, stage 3b: Secondary | ICD-10-CM

## 2020-08-09 DIAGNOSIS — E1151 Type 2 diabetes mellitus with diabetic peripheral angiopathy without gangrene: Secondary | ICD-10-CM | POA: Diagnosis not present

## 2020-08-09 DIAGNOSIS — E1140 Type 2 diabetes mellitus with diabetic neuropathy, unspecified: Secondary | ICD-10-CM | POA: Diagnosis not present

## 2020-08-09 DIAGNOSIS — L110 Acquired keratosis follicularis: Secondary | ICD-10-CM | POA: Diagnosis not present

## 2020-08-09 DIAGNOSIS — L609 Nail disorder, unspecified: Secondary | ICD-10-CM | POA: Diagnosis not present

## 2020-08-10 ENCOUNTER — Ambulatory Visit (HOSPITAL_COMMUNITY)
Admission: RE | Admit: 2020-08-10 | Discharge: 2020-08-10 | Disposition: A | Discharge status: Home / Self Care | Payer: Medicare Other | Source: Ambulatory Visit | Attending: Internal Medicine | Admitting: Internal Medicine

## 2020-08-10 ENCOUNTER — Other Ambulatory Visit: Payer: Self-pay

## 2020-08-10 DIAGNOSIS — N183 Chronic kidney disease, stage 3 unspecified: Secondary | ICD-10-CM | POA: Diagnosis not present

## 2020-08-10 DIAGNOSIS — N1832 Chronic kidney disease, stage 3b: Secondary | ICD-10-CM | POA: Diagnosis not present

## 2020-08-22 DIAGNOSIS — E119 Type 2 diabetes mellitus without complications: Secondary | ICD-10-CM | POA: Diagnosis not present

## 2020-08-22 DIAGNOSIS — E7800 Pure hypercholesterolemia, unspecified: Secondary | ICD-10-CM | POA: Diagnosis not present

## 2020-08-22 DIAGNOSIS — I4891 Unspecified atrial fibrillation: Secondary | ICD-10-CM | POA: Diagnosis not present

## 2020-08-22 DIAGNOSIS — I10 Essential (primary) hypertension: Secondary | ICD-10-CM | POA: Diagnosis not present

## 2020-08-29 DIAGNOSIS — E7800 Pure hypercholesterolemia, unspecified: Secondary | ICD-10-CM | POA: Diagnosis not present

## 2020-08-29 DIAGNOSIS — I10 Essential (primary) hypertension: Secondary | ICD-10-CM | POA: Diagnosis not present

## 2020-08-29 DIAGNOSIS — E1165 Type 2 diabetes mellitus with hyperglycemia: Secondary | ICD-10-CM | POA: Diagnosis not present

## 2020-08-29 DIAGNOSIS — Z794 Long term (current) use of insulin: Secondary | ICD-10-CM | POA: Diagnosis not present

## 2020-09-11 ENCOUNTER — Ambulatory Visit (INDEPENDENT_AMBULATORY_CARE_PROVIDER_SITE_OTHER): Payer: Medicare Other | Admitting: Cardiology

## 2020-09-11 ENCOUNTER — Encounter: Payer: Self-pay | Admitting: *Deleted

## 2020-09-11 ENCOUNTER — Encounter: Payer: Self-pay | Admitting: Cardiology

## 2020-09-11 VITALS — BP 134/74 | HR 58 | Ht 65.0 in | Wt 213.0 lb

## 2020-09-11 DIAGNOSIS — I10 Essential (primary) hypertension: Secondary | ICD-10-CM

## 2020-09-11 DIAGNOSIS — I480 Paroxysmal atrial fibrillation: Secondary | ICD-10-CM | POA: Diagnosis not present

## 2020-09-11 NOTE — Progress Notes (Signed)
Cardiology Office Note  Date: 09/11/2020   ID: [name], DOB 1935/11/16, MRN [id number]  PCP:  Glenda Chroman, MD  Cardiologist:  Rozann Lesches, MD Electrophysiologist:  None   Chief Complaint  Patient presents with  . Cardiac follow-up    History of Present Illness: [name] is an 84 y.o. female last seen in June. She presents for a routine visit. Reports no sense of palpitations or chest pain. We went over her medications which are listed below. She does not report any spontaneous bleeding problems.  I personally reviewed her ECG today which shows sinus bradycardia with prolonged PR interval, left anterior fascicular block/IVCD.  She continues to follow with Dr. Woody Seller, we are requesting interval lab work for review.  Past Medical History:  Diagnosis Date  . Atrial fibrillation ([place])   . Atrial flutter ([place])   . Diabetes mellitus without complication ([place])   . Essential hypertension, benign   . GERD (gastroesophageal reflux disease)   . Hyperlipidemia   . OSA on CPAP   . Vitamin D deficiency     Past Surgical History:  Procedure Laterality Date  . ABDOMINAL HYSTERECTOMY     Precancerous lesion  . CHOLECYSTECTOMY      Current Outpatient Medications  Medication Sig Dispense Refill  . amLODipine (NORVASC) 10 MG tablet Take 10 mg by mouth daily.    Marland Kitchen doxazosin (CARDURA) 8 MG tablet Take 4 mg by mouth at bedtime.    . flecainide (TAMBOCOR) 100 MG tablet TAKE 1 TABLET BY MOUTH TWICE A DAY 180 tablet 3  . gemfibrozil (LOPID) 600 MG tablet Take 600 mg by mouth 2 (two) times daily before a meal.    . hydrochlorothiazide 25 MG tablet Take 25 mg by mouth daily.    . insulin aspart (NOVOLOG) 100 UNIT/ML FlexPen Inject 10 Units into the skin 2 (two) times daily.    . Insulin Glargine (TOUJEO SOLOSTAR [place]) Inject 44 Units into the skin at bedtime.     . metoprolol tartrate (LOPRESSOR) 25 MG tablet TAKE 1 TABLET BY MOUTH TWICE A DAY 180 tablet 3  . pantoprazole  (PROTONIX) 40 MG tablet Take 40 mg by mouth daily.    Marland Kitchen venlafaxine XR (EFFEXOR-XR) 75 MG 24 hr capsule Take 75 mg by mouth daily.     . Vitamin D, Ergocalciferol, (DRISDOL) 1.25 MG (50000 UNIT) CAPS capsule Take 50,000 Units by mouth every 7 (seven) days.    Alveda Reasons 15 MG TABS tablet TAKE 1 TABLET BY MOUTH EVERY DAY 30 tablet 6   No current facility-administered medications for this visit.   Allergies:  Amoxicillin, Codeine phosphate, and Metformin hcl er   ROS: No syncope.  Physical Exam: VS:  BP 134/74   Pulse (!) 58   Ht 5\' 5"  (1.651 m)   Wt 213 lb (96.6 kg)   LMP  (LMP Unknown)   SpO2 98%   BMI 35.45 kg/m , BMI Body mass index is 35.45 kg/m.  Wt Readings from Last 3 Encounters:  09/11/20 213 lb (96.6 kg)  03/16/20 198 lb (89.8 kg)  10/25/19 199 lb (90.3 kg)    General: Elderly woman, appears comfortable at rest. HEENT: Conjunctiva and lids normal, wearing a mask. Neck: Supple, no elevated JVP or carotid bruits, no thyromegaly. Lungs: Clear to auscultation, nonlabored breathing at rest. Cardiac: Regular rate and rhythm, no S3, soft systolic murmur. Extremities: No pitting edema.  ECG:  An ECG dated 09/03/2019 was personally reviewed today and  demonstrated:  Sinus rhythm with prolonged PR interval and PAC, left anterior fascicular block.  Recent Labwork:  October 2020: BUN 29, creatinine 1.58, potassium 4.1, hemoglobin 11.9, platelets 248  Other Studies Reviewed Today:  Echocardiogram 03/19/2017: Study Conclusions   - Left ventricle: The cavity size was normal. Wall thickness was  normal. Systolic function was normal. The estimated ejection  fraction was in the range of 60% to 65%. Wall motion was normal;  there were no regional wall motion abnormalities. Diastolic  dysfunction, grade indeterminate. Elevated filling pressures.  - Aortic valve: Trileaflet; mildly thickened leaflets. There was  mild regurgitation.  - Mitral valve: Mildly calcified  annulus. Mildly thickened leaflets  . There was mild regurgitation.  - Left atrium: The atrium was mildly dilated.  - Right ventricle: Systolic function was mildly reduced.   Assessment and Plan:  1. Paroxysmal atrial fibrillation. CHA2DS2-VASc score is 5. She reports no palpitations and is in sinus rhythm today. Continue flecainide and Lopressor along with renally adjusted Xarelto.  2. Essential hypertension, no change in present regimen eludes Norvasc. She continues to follow with Dr. Woody Seller, we are requesting interval lab work.  Medication Adjustments/Labs and Tests Ordered: Current medicines are reviewed at length with the patient today.  Concerns regarding medicines are outlined above.   Tests Ordered: No orders of the defined types were placed in this encounter.   Medication Changes: No orders of the defined types were placed in this encounter.   Disposition:  Follow up 6 months in the [place] office.  Signed, Satira Sark, MD, [facility] 09/11/2020 2:04 PM    [facility] at [address], [place], [place] [address] Phone: [phone]; Fax: [phone]

## 2020-09-11 NOTE — Patient Instructions (Addendum)

## 2020-10-12 DIAGNOSIS — E1165 Type 2 diabetes mellitus with hyperglycemia: Secondary | ICD-10-CM | POA: Diagnosis not present

## 2020-10-12 DIAGNOSIS — I10 Essential (primary) hypertension: Secondary | ICD-10-CM | POA: Diagnosis not present

## 2020-10-12 DIAGNOSIS — N390 Urinary tract infection, site not specified: Secondary | ICD-10-CM | POA: Diagnosis not present

## 2020-10-12 DIAGNOSIS — N1832 Chronic kidney disease, stage 3b: Secondary | ICD-10-CM | POA: Diagnosis not present

## 2020-10-12 DIAGNOSIS — E7800 Pure hypercholesterolemia, unspecified: Secondary | ICD-10-CM | POA: Diagnosis not present

## 2020-10-16 DIAGNOSIS — I480 Paroxysmal atrial fibrillation: Secondary | ICD-10-CM | POA: Diagnosis not present

## 2020-10-16 DIAGNOSIS — I129 Hypertensive chronic kidney disease with stage 1 through stage 4 chronic kidney disease, or unspecified chronic kidney disease: Secondary | ICD-10-CM | POA: Diagnosis not present

## 2020-10-16 DIAGNOSIS — E1122 Type 2 diabetes mellitus with diabetic chronic kidney disease: Secondary | ICD-10-CM | POA: Diagnosis not present

## 2020-10-16 DIAGNOSIS — K219 Gastro-esophageal reflux disease without esophagitis: Secondary | ICD-10-CM | POA: Diagnosis not present

## 2020-10-16 DIAGNOSIS — E785 Hyperlipidemia, unspecified: Secondary | ICD-10-CM | POA: Diagnosis not present

## 2020-10-16 DIAGNOSIS — N1832 Chronic kidney disease, stage 3b: Secondary | ICD-10-CM | POA: Diagnosis not present

## 2020-10-23 DIAGNOSIS — E1165 Type 2 diabetes mellitus with hyperglycemia: Secondary | ICD-10-CM | POA: Diagnosis not present

## 2020-10-23 DIAGNOSIS — E7800 Pure hypercholesterolemia, unspecified: Secondary | ICD-10-CM | POA: Diagnosis not present

## 2020-10-23 DIAGNOSIS — I4891 Unspecified atrial fibrillation: Secondary | ICD-10-CM | POA: Diagnosis not present

## 2020-10-23 DIAGNOSIS — I10 Essential (primary) hypertension: Secondary | ICD-10-CM | POA: Diagnosis not present

## 2020-10-24 DIAGNOSIS — E1122 Type 2 diabetes mellitus with diabetic chronic kidney disease: Secondary | ICD-10-CM | POA: Diagnosis not present

## 2020-10-24 DIAGNOSIS — E1165 Type 2 diabetes mellitus with hyperglycemia: Secondary | ICD-10-CM | POA: Diagnosis not present

## 2020-10-24 DIAGNOSIS — I10 Essential (primary) hypertension: Secondary | ICD-10-CM | POA: Diagnosis not present

## 2020-10-24 DIAGNOSIS — N184 Chronic kidney disease, stage 4 (severe): Secondary | ICD-10-CM | POA: Diagnosis not present

## 2020-10-24 DIAGNOSIS — Z299 Encounter for prophylactic measures, unspecified: Secondary | ICD-10-CM | POA: Diagnosis not present

## 2020-10-29 ENCOUNTER — Other Ambulatory Visit: Payer: Self-pay | Admitting: Cardiology

## 2020-11-08 DIAGNOSIS — Z87891 Personal history of nicotine dependence: Secondary | ICD-10-CM | POA: Diagnosis not present

## 2020-11-08 DIAGNOSIS — R0789 Other chest pain: Secondary | ICD-10-CM | POA: Diagnosis not present

## 2020-11-08 DIAGNOSIS — I10 Essential (primary) hypertension: Secondary | ICD-10-CM | POA: Diagnosis not present

## 2020-11-08 DIAGNOSIS — Z299 Encounter for prophylactic measures, unspecified: Secondary | ICD-10-CM | POA: Diagnosis not present

## 2020-11-08 DIAGNOSIS — M79642 Pain in left hand: Secondary | ICD-10-CM | POA: Diagnosis not present

## 2020-11-08 DIAGNOSIS — E1129 Type 2 diabetes mellitus with other diabetic kidney complication: Secondary | ICD-10-CM | POA: Diagnosis not present

## 2020-11-08 DIAGNOSIS — G8911 Acute pain due to trauma: Secondary | ICD-10-CM | POA: Diagnosis not present

## 2020-11-08 DIAGNOSIS — Z043 Encounter for examination and observation following other accident: Secondary | ICD-10-CM | POA: Diagnosis not present

## 2020-11-15 DIAGNOSIS — Z299 Encounter for prophylactic measures, unspecified: Secondary | ICD-10-CM | POA: Diagnosis not present

## 2020-11-15 DIAGNOSIS — I10 Essential (primary) hypertension: Secondary | ICD-10-CM | POA: Diagnosis not present

## 2020-11-15 DIAGNOSIS — R0600 Dyspnea, unspecified: Secondary | ICD-10-CM | POA: Diagnosis not present

## 2020-11-15 DIAGNOSIS — R0789 Other chest pain: Secondary | ICD-10-CM | POA: Diagnosis not present

## 2020-11-20 DIAGNOSIS — E7800 Pure hypercholesterolemia, unspecified: Secondary | ICD-10-CM | POA: Diagnosis not present

## 2020-11-20 DIAGNOSIS — E1165 Type 2 diabetes mellitus with hyperglycemia: Secondary | ICD-10-CM | POA: Diagnosis not present

## 2020-11-20 DIAGNOSIS — I4891 Unspecified atrial fibrillation: Secondary | ICD-10-CM | POA: Diagnosis not present

## 2020-11-20 DIAGNOSIS — I10 Essential (primary) hypertension: Secondary | ICD-10-CM | POA: Diagnosis not present

## 2020-11-21 ENCOUNTER — Other Ambulatory Visit: Payer: Self-pay | Admitting: Cardiology

## 2020-11-21 NOTE — Telephone Encounter (Signed)
Prescription refill request for Xarelto received.  Indication: PAF Last office visit: 09/11/20 Weight: 96.6kg Age: 85 Scr: 2.37 CrCl: 27.91  Based on above findings Xarelto 15mg  daily (renal dose) is the appropriate dose.  Refill approved.

## 2020-11-27 DIAGNOSIS — E1165 Type 2 diabetes mellitus with hyperglycemia: Secondary | ICD-10-CM | POA: Diagnosis not present

## 2020-11-27 DIAGNOSIS — I10 Essential (primary) hypertension: Secondary | ICD-10-CM | POA: Diagnosis not present

## 2020-11-27 DIAGNOSIS — Z794 Long term (current) use of insulin: Secondary | ICD-10-CM | POA: Diagnosis not present

## 2020-11-27 DIAGNOSIS — E7800 Pure hypercholesterolemia, unspecified: Secondary | ICD-10-CM | POA: Diagnosis not present

## 2020-12-21 DIAGNOSIS — I10 Essential (primary) hypertension: Secondary | ICD-10-CM | POA: Diagnosis not present

## 2020-12-25 DIAGNOSIS — I4891 Unspecified atrial fibrillation: Secondary | ICD-10-CM | POA: Diagnosis not present

## 2020-12-25 DIAGNOSIS — I10 Essential (primary) hypertension: Secondary | ICD-10-CM | POA: Diagnosis not present

## 2020-12-25 DIAGNOSIS — E1122 Type 2 diabetes mellitus with diabetic chronic kidney disease: Secondary | ICD-10-CM | POA: Diagnosis not present

## 2020-12-25 DIAGNOSIS — E785 Hyperlipidemia, unspecified: Secondary | ICD-10-CM | POA: Diagnosis not present

## 2020-12-25 DIAGNOSIS — Z299 Encounter for prophylactic measures, unspecified: Secondary | ICD-10-CM | POA: Diagnosis not present

## 2021-01-15 DIAGNOSIS — N184 Chronic kidney disease, stage 4 (severe): Secondary | ICD-10-CM | POA: Diagnosis not present

## 2021-01-15 DIAGNOSIS — E785 Hyperlipidemia, unspecified: Secondary | ICD-10-CM | POA: Diagnosis not present

## 2021-01-15 DIAGNOSIS — I480 Paroxysmal atrial fibrillation: Secondary | ICD-10-CM | POA: Diagnosis not present

## 2021-01-15 DIAGNOSIS — N1832 Chronic kidney disease, stage 3b: Secondary | ICD-10-CM | POA: Diagnosis not present

## 2021-01-15 DIAGNOSIS — I129 Hypertensive chronic kidney disease with stage 1 through stage 4 chronic kidney disease, or unspecified chronic kidney disease: Secondary | ICD-10-CM | POA: Diagnosis not present

## 2021-01-19 DIAGNOSIS — I10 Essential (primary) hypertension: Secondary | ICD-10-CM | POA: Diagnosis not present

## 2021-01-25 DIAGNOSIS — E1122 Type 2 diabetes mellitus with diabetic chronic kidney disease: Secondary | ICD-10-CM | POA: Diagnosis not present

## 2021-01-25 DIAGNOSIS — I10 Essential (primary) hypertension: Secondary | ICD-10-CM | POA: Diagnosis not present

## 2021-01-25 DIAGNOSIS — Z299 Encounter for prophylactic measures, unspecified: Secondary | ICD-10-CM | POA: Diagnosis not present

## 2021-01-25 DIAGNOSIS — E1165 Type 2 diabetes mellitus with hyperglycemia: Secondary | ICD-10-CM | POA: Diagnosis not present

## 2021-01-25 DIAGNOSIS — N184 Chronic kidney disease, stage 4 (severe): Secondary | ICD-10-CM | POA: Diagnosis not present

## 2021-02-07 DIAGNOSIS — R413 Other amnesia: Secondary | ICD-10-CM | POA: Diagnosis not present

## 2021-02-19 DIAGNOSIS — E7800 Pure hypercholesterolemia, unspecified: Secondary | ICD-10-CM | POA: Diagnosis not present

## 2021-02-19 DIAGNOSIS — I4891 Unspecified atrial fibrillation: Secondary | ICD-10-CM | POA: Diagnosis not present

## 2021-02-19 DIAGNOSIS — E1165 Type 2 diabetes mellitus with hyperglycemia: Secondary | ICD-10-CM | POA: Diagnosis not present

## 2021-02-19 DIAGNOSIS — I10 Essential (primary) hypertension: Secondary | ICD-10-CM | POA: Diagnosis not present

## 2021-02-20 DIAGNOSIS — I10 Essential (primary) hypertension: Secondary | ICD-10-CM | POA: Diagnosis not present

## 2021-02-27 DIAGNOSIS — R413 Other amnesia: Secondary | ICD-10-CM | POA: Diagnosis not present

## 2021-02-27 DIAGNOSIS — E1122 Type 2 diabetes mellitus with diabetic chronic kidney disease: Secondary | ICD-10-CM | POA: Diagnosis not present

## 2021-02-27 DIAGNOSIS — E1165 Type 2 diabetes mellitus with hyperglycemia: Secondary | ICD-10-CM | POA: Diagnosis not present

## 2021-02-27 DIAGNOSIS — Z299 Encounter for prophylactic measures, unspecified: Secondary | ICD-10-CM | POA: Diagnosis not present

## 2021-02-27 DIAGNOSIS — I10 Essential (primary) hypertension: Secondary | ICD-10-CM | POA: Diagnosis not present

## 2021-02-28 ENCOUNTER — Other Ambulatory Visit (HOSPITAL_COMMUNITY): Payer: Self-pay | Admitting: Internal Medicine

## 2021-02-28 ENCOUNTER — Other Ambulatory Visit (HOSPITAL_COMMUNITY): Payer: Self-pay | Admitting: Otolaryngology

## 2021-02-28 DIAGNOSIS — N189 Chronic kidney disease, unspecified: Secondary | ICD-10-CM

## 2021-02-28 DIAGNOSIS — I10 Essential (primary) hypertension: Secondary | ICD-10-CM

## 2021-03-03 DIAGNOSIS — B372 Candidiasis of skin and nail: Secondary | ICD-10-CM | POA: Diagnosis not present

## 2021-03-07 ENCOUNTER — Other Ambulatory Visit: Payer: Self-pay

## 2021-03-07 ENCOUNTER — Ambulatory Visit (HOSPITAL_COMMUNITY)
Admission: RE | Admit: 2021-03-07 | Discharge: 2021-03-07 | Disposition: A | Discharge status: Home / Self Care | Payer: Medicare Other | Source: Ambulatory Visit | Attending: Internal Medicine | Admitting: Internal Medicine

## 2021-03-07 DIAGNOSIS — N189 Chronic kidney disease, unspecified: Secondary | ICD-10-CM | POA: Diagnosis not present

## 2021-03-07 DIAGNOSIS — I10 Essential (primary) hypertension: Secondary | ICD-10-CM | POA: Diagnosis not present

## 2021-03-07 DIAGNOSIS — N2889 Other specified disorders of kidney and ureter: Secondary | ICD-10-CM | POA: Diagnosis not present

## 2021-03-19 DIAGNOSIS — S72141A Displaced intertrochanteric fracture of right femur, initial encounter for closed fracture: Secondary | ICD-10-CM | POA: Diagnosis not present

## 2021-03-19 DIAGNOSIS — S72351A Displaced comminuted fracture of shaft of right femur, initial encounter for closed fracture: Secondary | ICD-10-CM | POA: Diagnosis not present

## 2021-03-19 DIAGNOSIS — S79911A Unspecified injury of right hip, initial encounter: Secondary | ICD-10-CM | POA: Diagnosis not present

## 2021-03-19 DIAGNOSIS — M952 Other acquired deformity of head: Secondary | ICD-10-CM | POA: Diagnosis not present

## 2021-03-19 DIAGNOSIS — Z01818 Encounter for other preprocedural examination: Secondary | ICD-10-CM | POA: Diagnosis not present

## 2021-03-19 DIAGNOSIS — N179 Acute kidney failure, unspecified: Secondary | ICD-10-CM | POA: Diagnosis not present

## 2021-03-19 DIAGNOSIS — G319 Degenerative disease of nervous system, unspecified: Secondary | ICD-10-CM | POA: Diagnosis not present

## 2021-03-19 DIAGNOSIS — S72301A Unspecified fracture of shaft of right femur, initial encounter for closed fracture: Secondary | ICD-10-CM | POA: Diagnosis not present

## 2021-03-19 DIAGNOSIS — W109XXA Fall (on) (from) unspecified stairs and steps, initial encounter: Secondary | ICD-10-CM | POA: Diagnosis not present

## 2021-03-19 DIAGNOSIS — J9811 Atelectasis: Secondary | ICD-10-CM | POA: Diagnosis not present

## 2021-03-19 DIAGNOSIS — U071 COVID-19: Secondary | ICD-10-CM | POA: Diagnosis not present

## 2021-03-19 DIAGNOSIS — M25551 Pain in right hip: Secondary | ICD-10-CM | POA: Diagnosis not present

## 2021-03-19 DIAGNOSIS — I517 Cardiomegaly: Secondary | ICD-10-CM | POA: Diagnosis not present

## 2021-03-19 DIAGNOSIS — I700 Atherosclerosis of aorta: Secondary | ICD-10-CM | POA: Diagnosis not present

## 2021-03-19 DIAGNOSIS — G936 Cerebral edema: Secondary | ICD-10-CM | POA: Diagnosis not present

## 2021-03-19 DIAGNOSIS — N184 Chronic kidney disease, stage 4 (severe): Secondary | ICD-10-CM | POA: Diagnosis not present

## 2021-03-19 DIAGNOSIS — S7291XA Unspecified fracture of right femur, initial encounter for closed fracture: Secondary | ICD-10-CM | POA: Diagnosis not present

## 2021-03-19 DIAGNOSIS — S7221XA Displaced subtrochanteric fracture of right femur, initial encounter for closed fracture: Secondary | ICD-10-CM | POA: Diagnosis not present

## 2021-03-19 DIAGNOSIS — I6381 Other cerebral infarction due to occlusion or stenosis of small artery: Secondary | ICD-10-CM | POA: Diagnosis not present

## 2021-03-19 DIAGNOSIS — I63321 Cerebral infarction due to thrombosis of right anterior cerebral artery: Secondary | ICD-10-CM | POA: Diagnosis not present

## 2021-03-19 DIAGNOSIS — S72001A Fracture of unspecified part of neck of right femur, initial encounter for closed fracture: Secondary | ICD-10-CM | POA: Diagnosis not present

## 2021-03-20 DIAGNOSIS — D631 Anemia in chronic kidney disease: Secondary | ICD-10-CM | POA: Diagnosis not present

## 2021-03-20 DIAGNOSIS — Z6837 Body mass index (BMI) 37.0-37.9, adult: Secondary | ICD-10-CM | POA: Diagnosis not present

## 2021-03-20 DIAGNOSIS — E1165 Type 2 diabetes mellitus with hyperglycemia: Secondary | ICD-10-CM | POA: Diagnosis not present

## 2021-03-20 DIAGNOSIS — E119 Type 2 diabetes mellitus without complications: Secondary | ICD-10-CM | POA: Diagnosis not present

## 2021-03-20 DIAGNOSIS — F0390 Unspecified dementia without behavioral disturbance: Secondary | ICD-10-CM | POA: Diagnosis not present

## 2021-03-20 DIAGNOSIS — Z888 Allergy status to other drugs, medicaments and biological substances status: Secondary | ICD-10-CM | POA: Diagnosis not present

## 2021-03-20 DIAGNOSIS — I679 Cerebrovascular disease, unspecified: Secondary | ICD-10-CM | POA: Diagnosis not present

## 2021-03-20 DIAGNOSIS — N184 Chronic kidney disease, stage 4 (severe): Secondary | ICD-10-CM | POA: Diagnosis not present

## 2021-03-20 DIAGNOSIS — T50905D Adverse effect of unspecified drugs, medicaments and biological substances, subsequent encounter: Secondary | ICD-10-CM | POA: Diagnosis not present

## 2021-03-20 DIAGNOSIS — E785 Hyperlipidemia, unspecified: Secondary | ICD-10-CM | POA: Diagnosis present

## 2021-03-20 DIAGNOSIS — S72001D Fracture of unspecified part of neck of right femur, subsequent encounter for closed fracture with routine healing: Secondary | ICD-10-CM | POA: Diagnosis not present

## 2021-03-20 DIAGNOSIS — Z885 Allergy status to narcotic agent status: Secondary | ICD-10-CM | POA: Diagnosis not present

## 2021-03-20 DIAGNOSIS — E669 Obesity, unspecified: Secondary | ICD-10-CM | POA: Diagnosis present

## 2021-03-20 DIAGNOSIS — I639 Cerebral infarction, unspecified: Secondary | ICD-10-CM | POA: Diagnosis not present

## 2021-03-20 DIAGNOSIS — T50905A Adverse effect of unspecified drugs, medicaments and biological substances, initial encounter: Secondary | ICD-10-CM | POA: Diagnosis not present

## 2021-03-20 DIAGNOSIS — F419 Anxiety disorder, unspecified: Secondary | ICD-10-CM | POA: Diagnosis present

## 2021-03-20 DIAGNOSIS — I69354 Hemiplegia and hemiparesis following cerebral infarction affecting left non-dominant side: Secondary | ICD-10-CM | POA: Diagnosis not present

## 2021-03-20 DIAGNOSIS — I69391 Dysphagia following cerebral infarction: Secondary | ICD-10-CM | POA: Diagnosis not present

## 2021-03-20 DIAGNOSIS — I517 Cardiomegaly: Secondary | ICD-10-CM | POA: Diagnosis not present

## 2021-03-20 DIAGNOSIS — R4182 Altered mental status, unspecified: Secondary | ICD-10-CM | POA: Diagnosis not present

## 2021-03-20 DIAGNOSIS — Z515 Encounter for palliative care: Secondary | ICD-10-CM | POA: Diagnosis not present

## 2021-03-20 DIAGNOSIS — S7221XD Displaced subtrochanteric fracture of right femur, subsequent encounter for closed fracture with routine healing: Secondary | ICD-10-CM | POA: Diagnosis not present

## 2021-03-20 DIAGNOSIS — Z66 Do not resuscitate: Secondary | ICD-10-CM | POA: Diagnosis not present

## 2021-03-20 DIAGNOSIS — N179 Acute kidney failure, unspecified: Secondary | ICD-10-CM | POA: Diagnosis not present

## 2021-03-20 DIAGNOSIS — I129 Hypertensive chronic kidney disease with stage 1 through stage 4 chronic kidney disease, or unspecified chronic kidney disease: Secondary | ICD-10-CM | POA: Diagnosis present

## 2021-03-20 DIAGNOSIS — R0902 Hypoxemia: Secondary | ICD-10-CM | POA: Diagnosis not present

## 2021-03-20 DIAGNOSIS — I10 Essential (primary) hypertension: Secondary | ICD-10-CM | POA: Diagnosis not present

## 2021-03-20 DIAGNOSIS — I672 Cerebral atherosclerosis: Secondary | ICD-10-CM | POA: Diagnosis not present

## 2021-03-20 DIAGNOSIS — S7221XA Displaced subtrochanteric fracture of right femur, initial encounter for closed fracture: Secondary | ICD-10-CM | POA: Diagnosis present

## 2021-03-20 DIAGNOSIS — Z8673 Personal history of transient ischemic attack (TIA), and cerebral infarction without residual deficits: Secondary | ICD-10-CM | POA: Diagnosis not present

## 2021-03-20 DIAGNOSIS — R451 Restlessness and agitation: Secondary | ICD-10-CM | POA: Diagnosis not present

## 2021-03-20 DIAGNOSIS — S72341A Displaced spiral fracture of shaft of right femur, initial encounter for closed fracture: Secondary | ICD-10-CM | POA: Diagnosis present

## 2021-03-20 DIAGNOSIS — R600 Localized edema: Secondary | ICD-10-CM | POA: Diagnosis not present

## 2021-03-20 DIAGNOSIS — I4891 Unspecified atrial fibrillation: Secondary | ICD-10-CM | POA: Diagnosis present

## 2021-03-20 DIAGNOSIS — S72001A Fracture of unspecified part of neck of right femur, initial encounter for closed fracture: Secondary | ICD-10-CM | POA: Diagnosis not present

## 2021-03-20 DIAGNOSIS — D649 Anemia, unspecified: Secondary | ICD-10-CM | POA: Diagnosis not present

## 2021-03-20 DIAGNOSIS — E1122 Type 2 diabetes mellitus with diabetic chronic kidney disease: Secondary | ICD-10-CM | POA: Diagnosis present

## 2021-03-20 DIAGNOSIS — Z794 Long term (current) use of insulin: Secondary | ICD-10-CM | POA: Diagnosis not present

## 2021-03-20 DIAGNOSIS — D62 Acute posthemorrhagic anemia: Secondary | ICD-10-CM | POA: Diagnosis present

## 2021-03-20 DIAGNOSIS — U071 COVID-19: Secondary | ICD-10-CM | POA: Diagnosis present

## 2021-03-20 DIAGNOSIS — I6523 Occlusion and stenosis of bilateral carotid arteries: Secondary | ICD-10-CM | POA: Diagnosis not present

## 2021-03-20 DIAGNOSIS — I482 Chronic atrial fibrillation, unspecified: Secondary | ICD-10-CM | POA: Diagnosis not present

## 2021-03-20 DIAGNOSIS — E7800 Pure hypercholesterolemia, unspecified: Secondary | ICD-10-CM | POA: Diagnosis not present

## 2021-03-20 DIAGNOSIS — G936 Cerebral edema: Secondary | ICD-10-CM | POA: Diagnosis not present

## 2021-03-20 DIAGNOSIS — G8194 Hemiplegia, unspecified affecting left nondominant side: Secondary | ICD-10-CM | POA: Diagnosis not present

## 2021-03-20 DIAGNOSIS — I480 Paroxysmal atrial fibrillation: Secondary | ICD-10-CM | POA: Diagnosis not present

## 2021-03-20 DIAGNOSIS — S72341D Displaced spiral fracture of shaft of right femur, subsequent encounter for closed fracture with routine healing: Secondary | ICD-10-CM | POA: Diagnosis not present

## 2021-03-20 DIAGNOSIS — J811 Chronic pulmonary edema: Secondary | ICD-10-CM | POA: Diagnosis not present

## 2021-03-20 DIAGNOSIS — Z880 Allergy status to penicillin: Secondary | ICD-10-CM | POA: Diagnosis not present

## 2021-03-20 DIAGNOSIS — I63321 Cerebral infarction due to thrombosis of right anterior cerebral artery: Secondary | ICD-10-CM | POA: Diagnosis not present

## 2021-03-20 DIAGNOSIS — W19XXXD Unspecified fall, subsequent encounter: Secondary | ICD-10-CM | POA: Diagnosis not present

## 2021-03-20 DIAGNOSIS — Z7901 Long term (current) use of anticoagulants: Secondary | ICD-10-CM | POA: Diagnosis not present

## 2021-03-20 DIAGNOSIS — R1310 Dysphagia, unspecified: Secondary | ICD-10-CM | POA: Diagnosis not present

## 2021-03-22 DIAGNOSIS — E7800 Pure hypercholesterolemia, unspecified: Secondary | ICD-10-CM | POA: Diagnosis not present

## 2021-03-22 DIAGNOSIS — I10 Essential (primary) hypertension: Secondary | ICD-10-CM | POA: Diagnosis not present

## 2021-03-22 DIAGNOSIS — E1165 Type 2 diabetes mellitus with hyperglycemia: Secondary | ICD-10-CM | POA: Diagnosis not present

## 2021-03-22 DIAGNOSIS — I4891 Unspecified atrial fibrillation: Secondary | ICD-10-CM | POA: Diagnosis not present

## 2021-04-05 ENCOUNTER — Ambulatory Visit: Payer: Medicare Other | Admitting: Family Medicine

## 2021-04-23 DEATH — deceased

## 2021-10-12 IMAGING — US US RENAL
1 series · 14 of 25 positions shown · non-contrast
Comparison: None.

CLINICAL DATA: Stage 3 chronic kidney disease.

EXAM:
RENAL / URINARY TRACT ULTRASOUND COMPLETE

[Series 1: us renal · 14 of 42 slices shown]
[im 1/42]
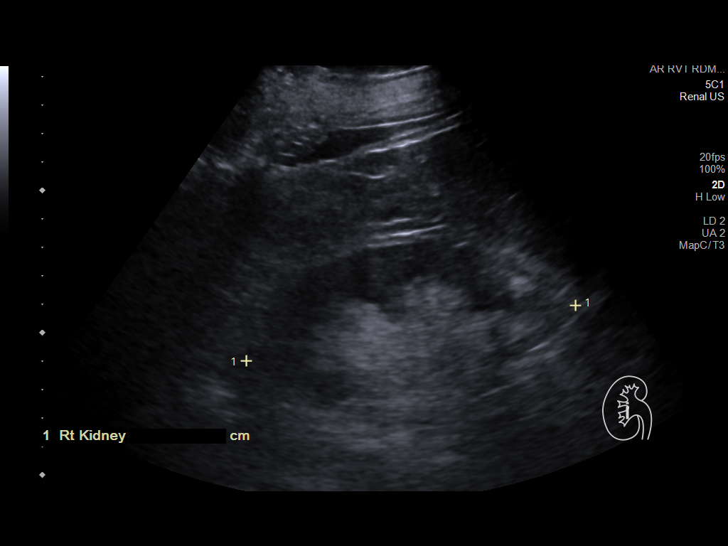
[im 4/42]
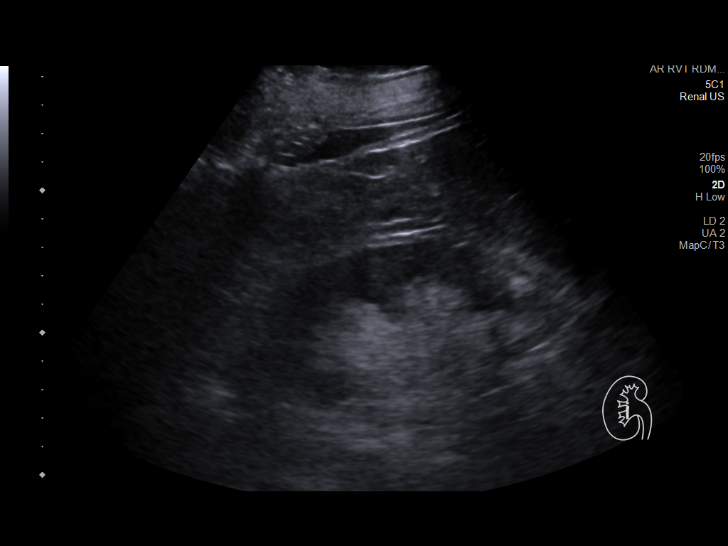
[im 7/42]
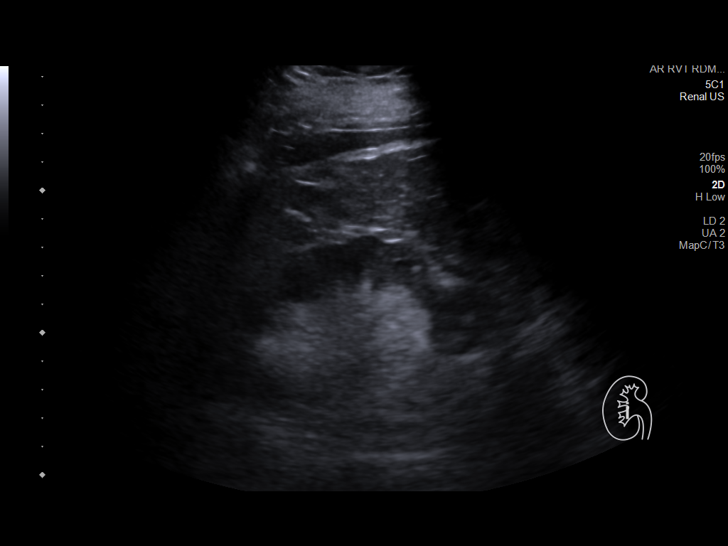
[im 11/42]
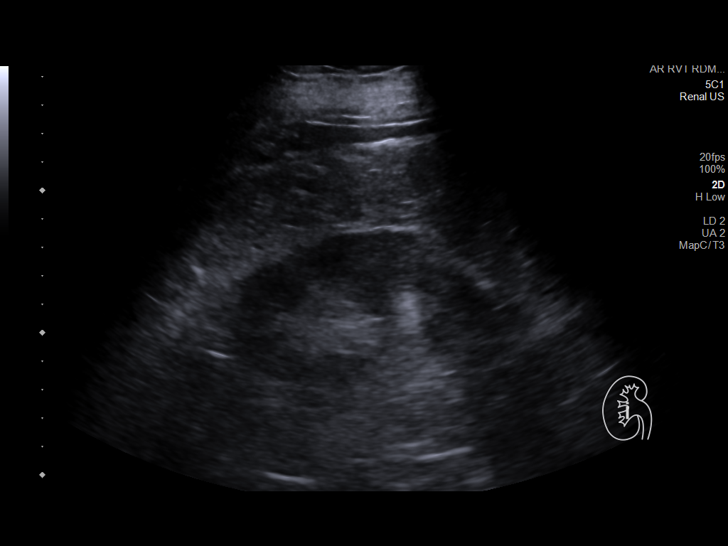
[im 14/42]
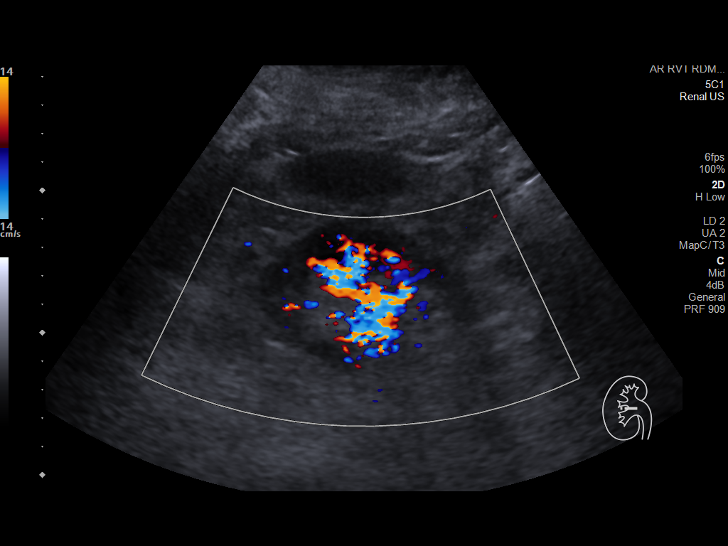
[im 16/42]
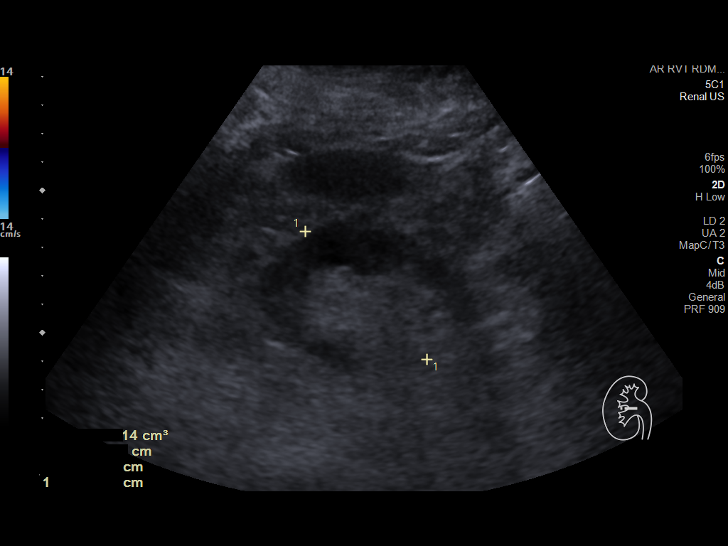
[im 19/42]
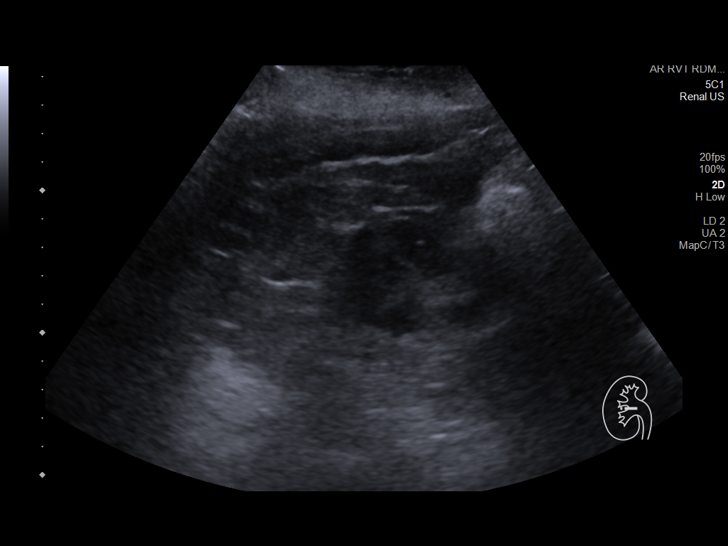
[im 23/42]
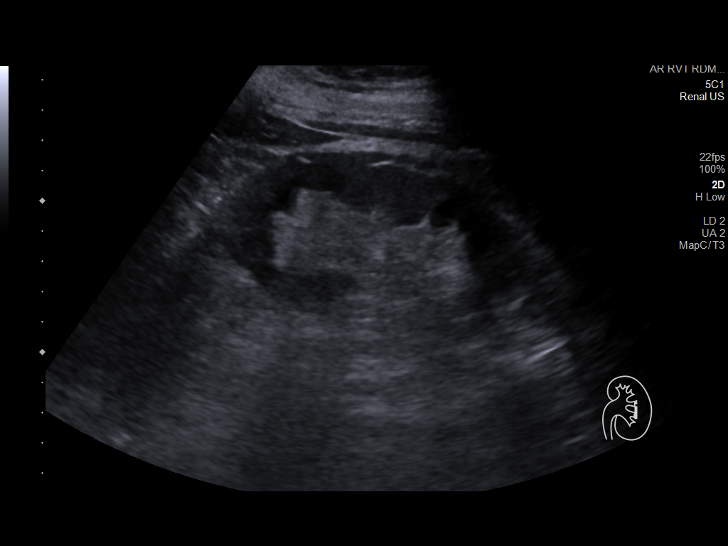
[im 26/42]
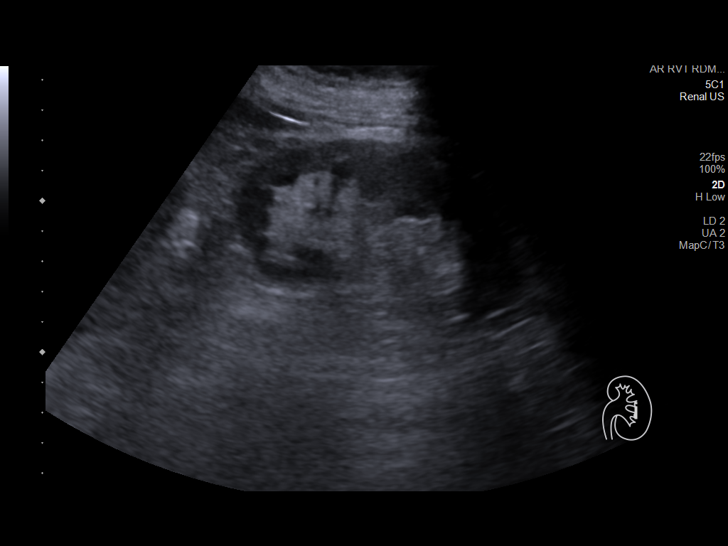
[im 28/42]
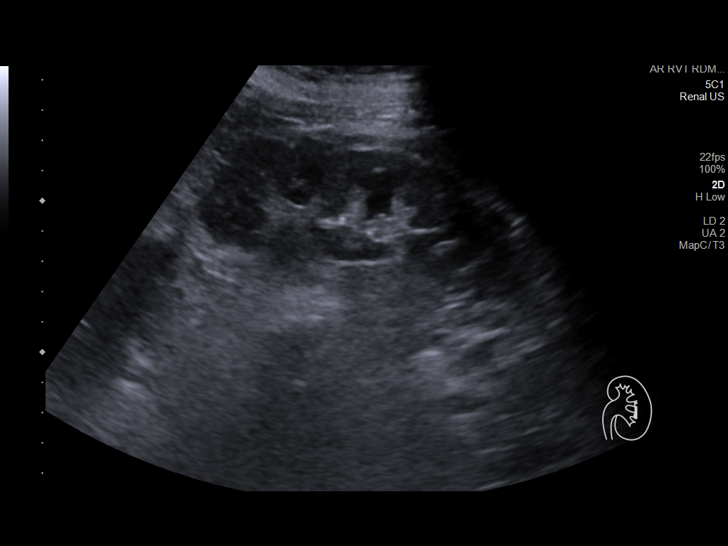
[im 31/42]
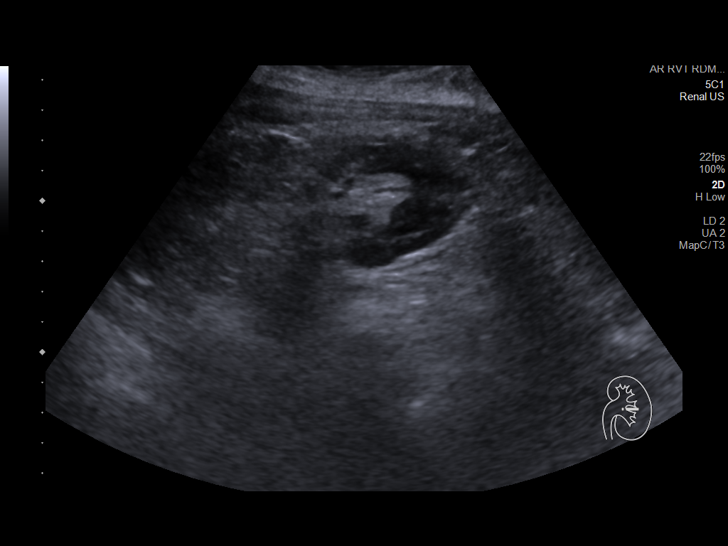
[im 35/42]
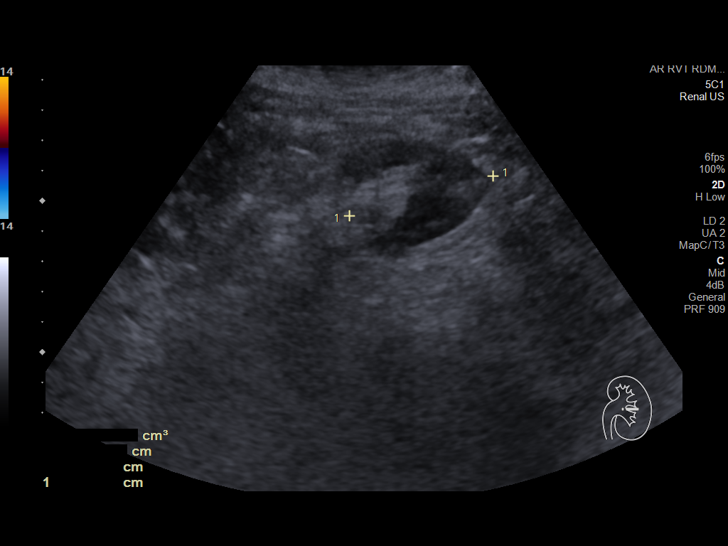
[im 38/42]
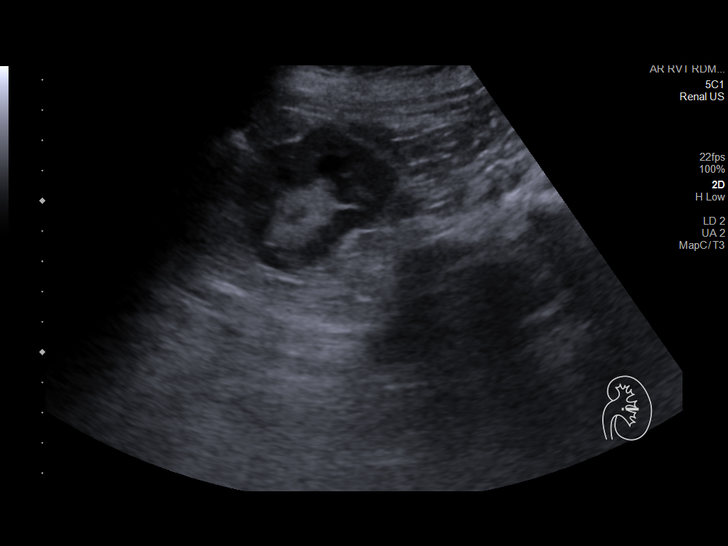
[im 42/42]
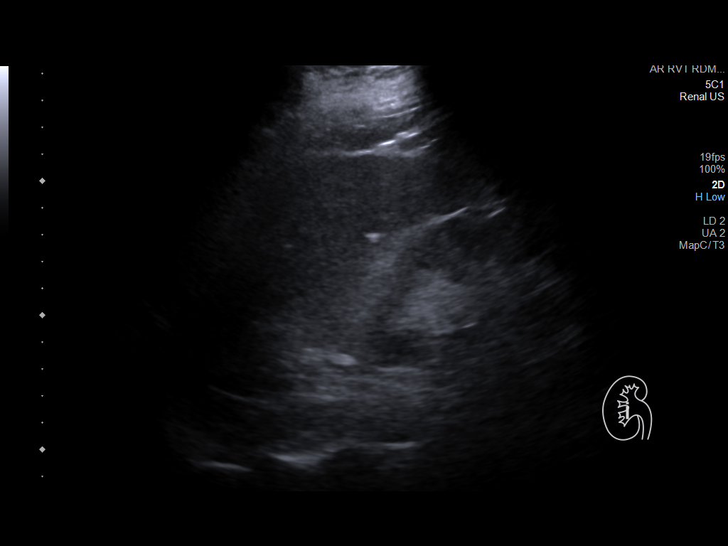

[14 of 25 positions shown; findings below may reference images not displayed]

FINDINGS: Right Kidney:

Renal measurements: 12.0 x 5.7 x 6.2 cm = volume: 220.14 mL.
Echogenicity within normal limits. No mass or hydronephrosis
visualized.

Left Kidney:

Renal measurements: 11.0 x 5.8 x 4.9 cm = volume: 165.37 mL.
Echogenicity within normal limits. No mass or hydronephrosis
visualized.

Bladder:

Appears normal for degree of bladder distention.

Other:

None.
IMPRESSION: 1. Normal renal sonogram.

## 2022-05-09 IMAGING — US US RENAL
1 series · 14 of 25 positions shown · non-contrast
Comparison: 08/10/2020

CLINICAL DATA: Uncontrolled hypertension

EXAM:
RENAL / URINARY TRACT ULTRASOUND COMPLETE

[Series 1: us renal · 0.25mm/px · 14 of 51 slices shown]
[im 1/51]
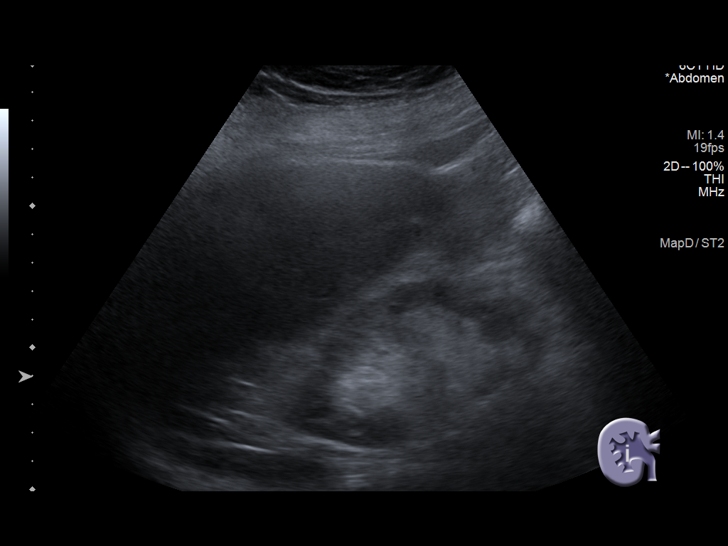
[im 5/51]
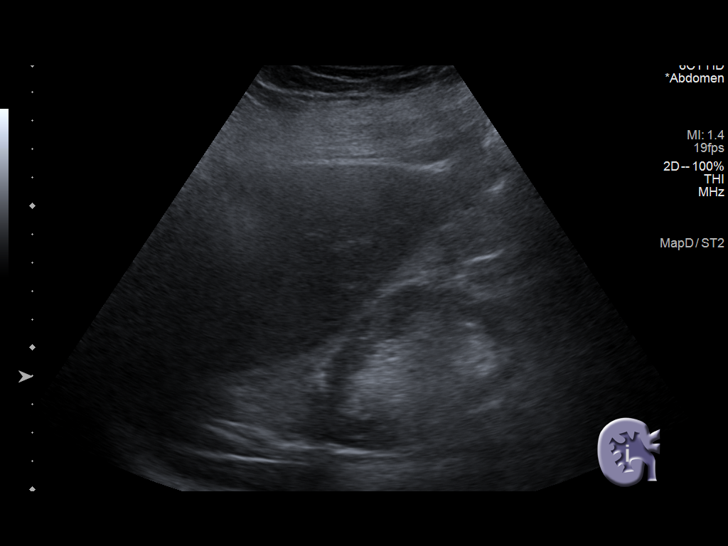
[im 9/51]
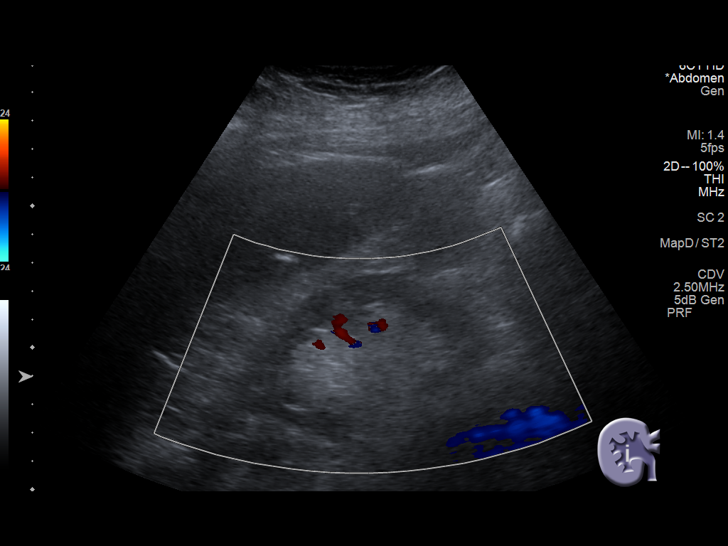
[im 13/51]
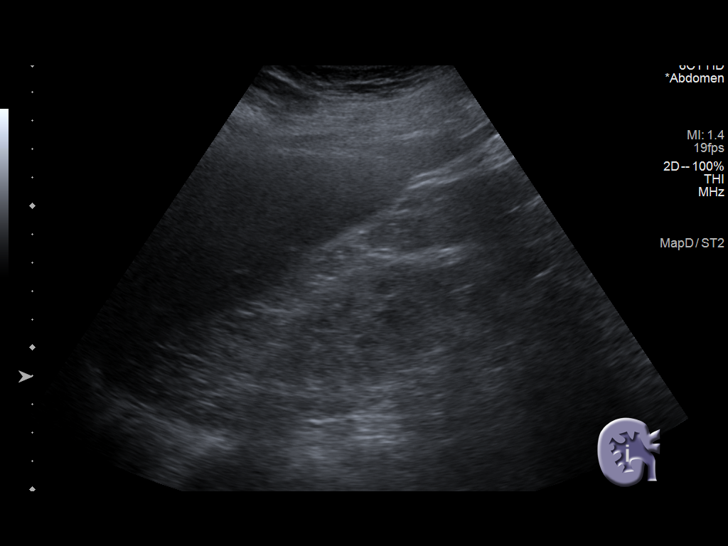
[im 17/51]
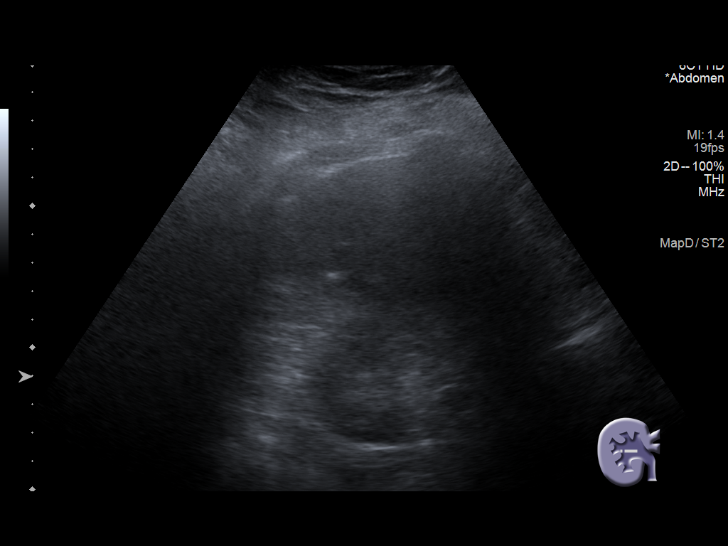
[im 19/51]
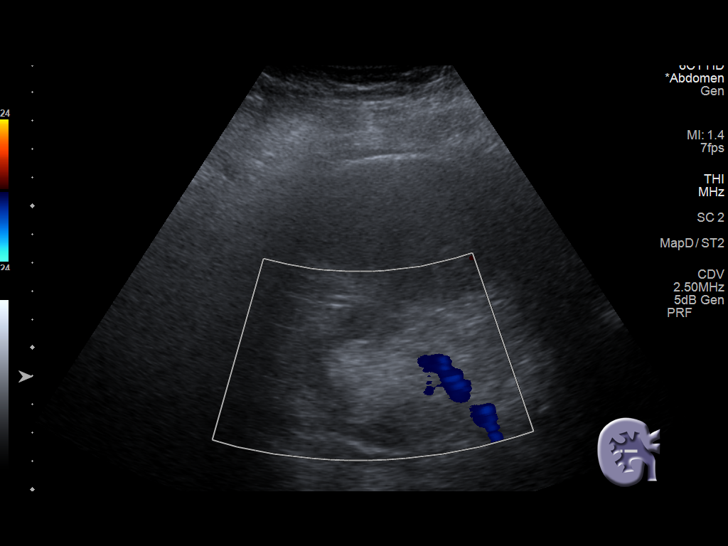
[im 23/51]
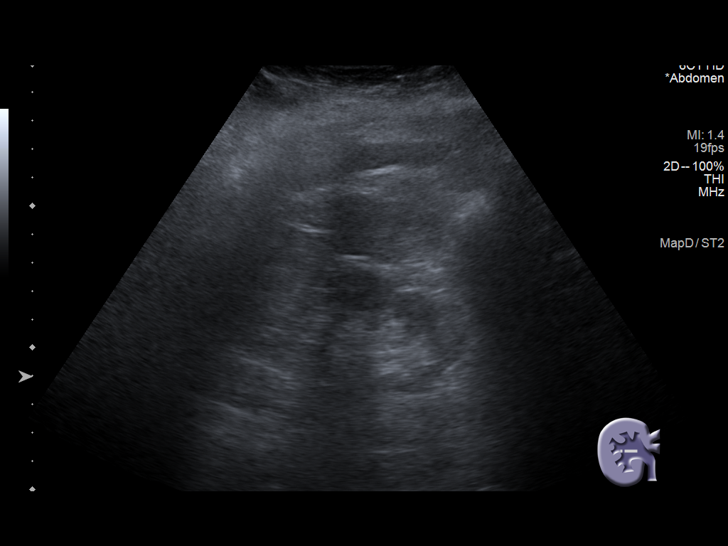
[im 28/51]
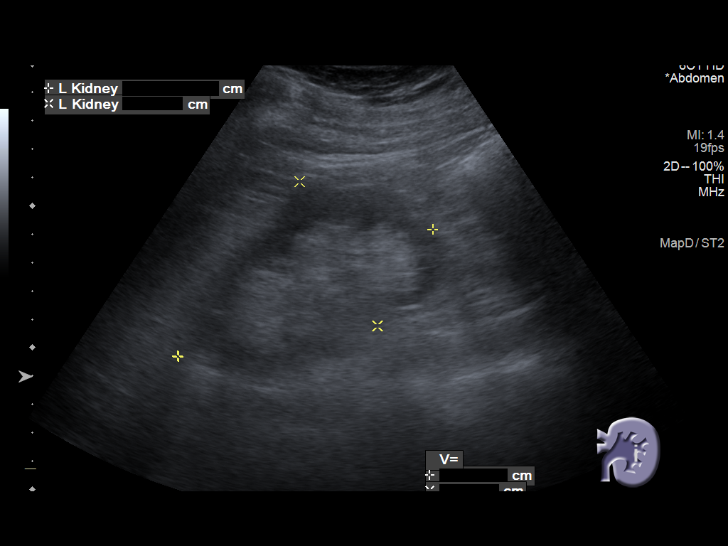
[im 32/51]
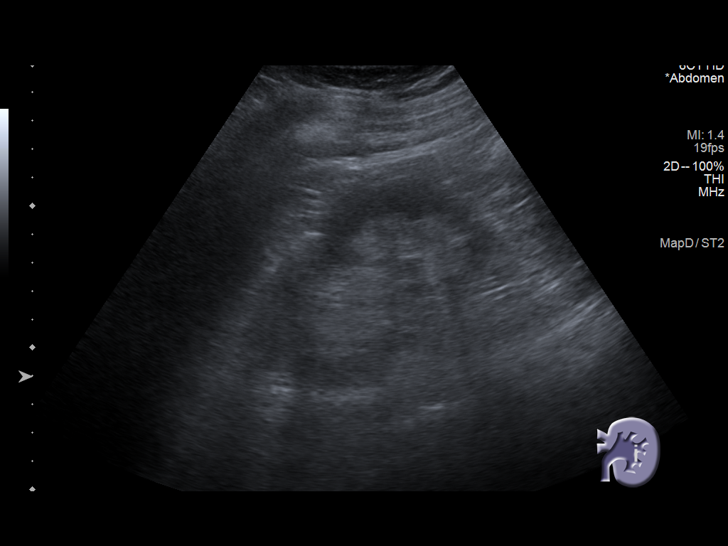
[im 34/51]
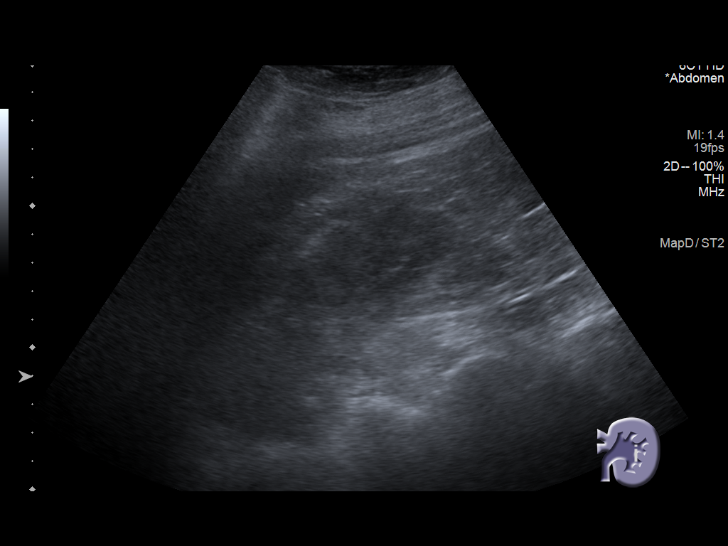
[im 38/51]
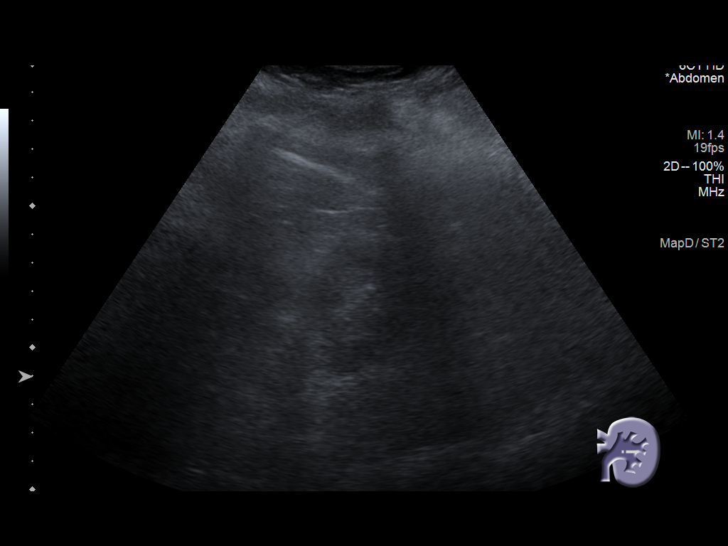
[im 42/51]
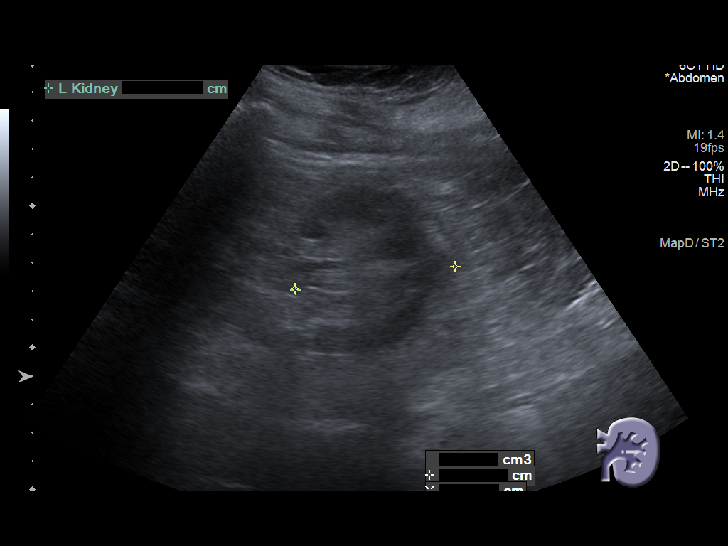
[im 46/51]
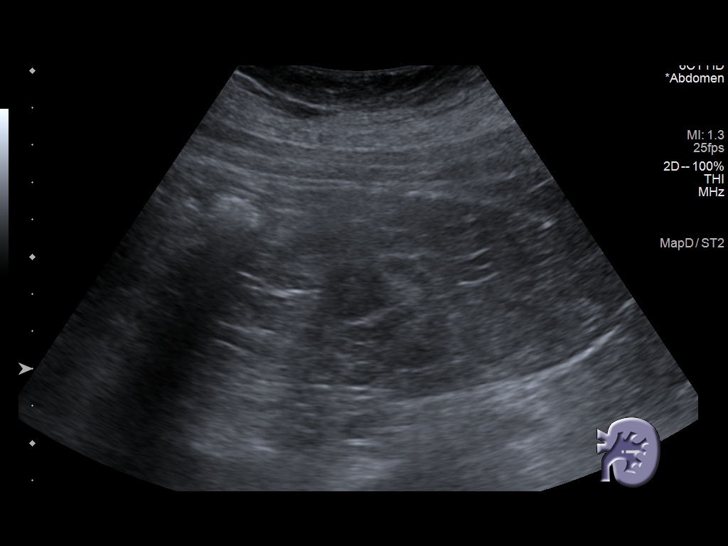
[im 51/51]
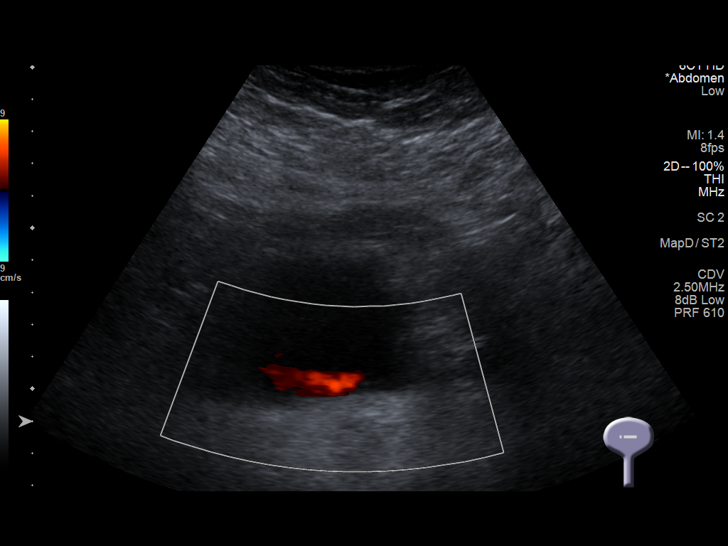

[14 of 25 positions shown; findings below may reference images not displayed]

FINDINGS: Right Kidney:

Renal measurements: 10.4 x 4.7 x 5.0 cm = volume: 129 mL. No mass or
hydronephrosis. Mild diffuse thinning of the renal cortex.

Left Kidney:

Renal measurements: 10.1 x 5.8 x 5.8 cm = volume: 173 mL. No mass or
hydronephrosis. Mild diffuse thinning of renal cortex.

Bladder:

Appears normal for degree of bladder distention.

Other:

None.
IMPRESSION: Mild diffuse thinning of the renal cortices consistent with chronic
medical renal disease.
